# Patient Record
Sex: Male | Born: 1995 | Race: Black or African American | Hispanic: No | Marital: Single | Smoking: Former smoker
Health system: Southern US, Community
[De-identification: ages and names within clinical notes are randomized; demographics above are authoritative.]

## PROBLEM LIST (undated history)

## (undated) ENCOUNTER — Ambulatory Visit: Admission: EM | Source: Home / Self Care

## (undated) DIAGNOSIS — K2921 Alcoholic gastritis with bleeding: Secondary | ICD-10-CM

## (undated) HISTORY — PX: DENTAL RESTORATION/EXTRACTION WITH X-RAY: SHX5796

---

## 2017-08-16 ENCOUNTER — Other Ambulatory Visit: Payer: Self-pay

## 2017-08-16 ENCOUNTER — Encounter: Payer: Self-pay | Admitting: *Deleted

## 2017-08-16 ENCOUNTER — Emergency Department
Admission: EM | Admit: 2017-08-16 | Discharge: 2017-08-17 | Disposition: A | Discharge status: Home / Self Care | Payer: Self-pay | Attending: Emergency Medicine | Admitting: Emergency Medicine

## 2017-08-16 DIAGNOSIS — F329 Major depressive disorder, single episode, unspecified: Secondary | ICD-10-CM | POA: Insufficient documentation

## 2017-08-16 DIAGNOSIS — F4325 Adjustment disorder with mixed disturbance of emotions and conduct: Secondary | ICD-10-CM

## 2017-08-16 DIAGNOSIS — F17210 Nicotine dependence, cigarettes, uncomplicated: Secondary | ICD-10-CM | POA: Insufficient documentation

## 2017-08-16 DIAGNOSIS — F1020 Alcohol dependence, uncomplicated: Secondary | ICD-10-CM

## 2017-08-16 DIAGNOSIS — K2970 Gastritis, unspecified, without bleeding: Secondary | ICD-10-CM

## 2017-08-16 DIAGNOSIS — R45851 Suicidal ideations: Secondary | ICD-10-CM | POA: Insufficient documentation

## 2017-08-16 DIAGNOSIS — F1021 Alcohol dependence, in remission: Secondary | ICD-10-CM

## 2017-08-16 DIAGNOSIS — T424X1A Poisoning by benzodiazepines, accidental (unintentional), initial encounter: Secondary | ICD-10-CM

## 2017-08-16 DIAGNOSIS — T424X2A Poisoning by benzodiazepines, intentional self-harm, initial encounter: Secondary | ICD-10-CM | POA: Insufficient documentation

## 2017-08-16 DIAGNOSIS — F322 Major depressive disorder, single episode, severe without psychotic features: Secondary | ICD-10-CM

## 2017-08-16 DIAGNOSIS — T50902A Poisoning by unspecified drugs, medicaments and biological substances, intentional self-harm, initial encounter: Secondary | ICD-10-CM

## 2017-08-16 DIAGNOSIS — F339 Major depressive disorder, recurrent, unspecified: Secondary | ICD-10-CM | POA: Insufficient documentation

## 2017-08-16 HISTORY — DX: Poisoning by benzodiazepines, accidental (unintentional), initial encounter: T42.4X1A

## 2017-08-16 HISTORY — DX: Alcoholic gastritis with bleeding: K29.21

## 2017-08-16 LAB — CBC
HCT: 47.5 % (ref 40.0–52.0)
Hemoglobin: 16.5 g/dL (ref 13.0–18.0)
MCH: 32.7 pg (ref 26.0–34.0)
MCHC: 34.7 g/dL (ref 32.0–36.0)
MCV: 94.1 fL (ref 80.0–100.0)
PLATELETS: 213 10*3/uL (ref 150–440)
RBC: 5.05 MIL/uL (ref 4.40–5.90)
RDW: 13.7 % (ref 11.5–14.5)
WBC: 6.5 10*3/uL (ref 3.8–10.6)

## 2017-08-16 LAB — COMPREHENSIVE METABOLIC PANEL
ALBUMIN: 4.3 g/dL (ref 3.5–5.0)
ALK PHOS: 51 U/L (ref 38–126)
ALT: 15 U/L (ref 0–44)
AST: 18 U/L (ref 15–41)
Anion gap: 10 (ref 5–15)
BILIRUBIN TOTAL: 0.6 mg/dL (ref 0.3–1.2)
BUN: 14 mg/dL (ref 6–20)
CALCIUM: 9.3 mg/dL (ref 8.9–10.3)
CO2: 25 mmol/L (ref 22–32)
CREATININE: 1.01 mg/dL (ref 0.61–1.24)
Chloride: 107 mmol/L (ref 98–111)
GFR calc Af Amer: >60 mL/min (ref >60)
GFR calc non Af Amer: >60 mL/min (ref >60)
GLUCOSE: 89 mg/dL (ref 70–99)
Potassium: 3.8 mmol/L (ref 3.5–5.1)
Sodium: 142 mmol/L (ref 135–145)
Total Protein: 7.4 g/dL (ref 6.5–8.1)

## 2017-08-16 LAB — ETHANOL: Alcohol, Ethyl (B): 75 mg/dL — ABNORMAL HIGH (ref <10)

## 2017-08-16 MED ORDER — TRAZODONE HCL 100 MG PO TABS: 100.0000 mg | ORAL_TABLET | Freq: Every evening | ORAL | Status: DC | PRN

## 2017-08-16 MED ORDER — PANTOPRAZOLE SODIUM 40 MG PO TBEC
40.0000 mg | DELAYED_RELEASE_TABLET | Freq: Two times a day (BID) | ORAL | Status: DC
  Administered 2017-08-17 (×2): 40 mg via ORAL
  Filled 2017-08-16 (×2): qty 1

## 2017-08-16 MED ORDER — HYDROXYZINE HCL 25 MG PO TABS
50.0000 mg | ORAL_TABLET | Freq: Four times a day (QID) | ORAL | Status: DC | PRN
  Administered 2017-08-17: 50 mg via ORAL
  Filled 2017-08-16 (×2): qty 2

## 2017-08-16 NOTE — Consult Note (Signed)
[place] [facility] Consult   Reason for Consult: Consult for 22 year old man who comes to the hospital after overdosing intentionally on clonazepam Referring Physician: [facility] Patient Identification: Kevontay Burks MRN:  [id number] Principal Diagnosis: Severe major depression, single episode, without psychotic features [facility]) Diagnosis:   Patient Active Problem List   Diagnosis Date Noted  . Severe major depression, single episode, without psychotic features ([place]) [F32.2] 08/16/2017  . Benzodiazepine overdose [T42.4X1A] 08/16/2017    Total Time spent with patient: 1 hour  Subjective:   Cejay Cambre is a 22 y.o. male patient admitted with "I just did not want to go on".  HPI: Patient interviewed chart reviewed.  28 year old man came to the emergency room after his roommate called the authorities.  Patient says that he took about 18 tablets of clonazepam today.  He says that he took about 6 of them by mouth and then ground the rest of them up and snorted them.  Admits that he has been drinking heavily recently as well although he was not drinking today.  Last drink was last night.  Patient went to his doctor earlier today because of a recurrent gastritis with GI bleeding and was told that he could not drink which apparently is why he chose to use the Klonopin instead.  Patient says his mood recently has been down and depressed.  Sleep is chronically poor.  Appetite is poor.  He says he is having suicidal thoughts and feels like life is not worth living and that he has nothing to live for.  Denies any psychosis.  Major life stresses are that he is living with a man he is in a relationship with but the relationship is becoming disappointing and frustrating to him.  Social history: Minimal family contact.  Lives with a man that he is in a relationship with but seems to find the relationship unsatisfactory.  Medical history: Patient has had recurrent episodes of vomiting blood from gastritis  from alcohol abuse.  Substance abuse history: Long-standing heavy alcohol abuse.  No history of seizures or delirium tremens.  Has not been involved in detox treatment or rehab  Past Psychiatric History: No history of psychiatric treatment.  Never seen a mental health provider in the past.  No history of psychiatric medicine or hospitalization  Risk to Self: Suicidal Ideation: Yes-Currently Present Suicidal Intent: Yes-Currently Present Is patient at risk for suicide?: Yes Suicidal Plan?: Yes-Currently Present Specify Current Suicidal Plan: OD on Klonopin Access to Means: Yes Specify Access to Suicidal Means: pills What has been your use of drugs/alcohol within the last 12 months?: ETOH How many times?: 1 Other Self Harm Risks: Ongoing ETOH abuse Triggers for Past Attempts: Other personal contacts Intentional Self Injurious Behavior: None Risk to Others: Homicidal Ideation: No Thoughts of Harm to Others: No Current Homicidal Intent: No Current Homicidal Plan: No Access to Homicidal Means: No Identified Victim: None reported History of harm to others?: No Assessment of Violence: None Noted Violent Behavior Description: None reported Does patient have access to weapons?: No Criminal Charges Pending?: No Does patient have a court date: No Prior Inpatient Therapy: Prior Inpatient Therapy: No Prior Outpatient Therapy: Prior Outpatient Therapy: No Does patient have an ACCT team?: No Does patient have Intensive In-House Services?  : No Does patient have [facility] services? : No Does patient have P4CC services?: No  Past Medical History:  Past Medical History:  Diagnosis Date  . Alcoholic gastritis with bleeding     Family History: No family history on  file. Family Psychiatric  History: Does not know of any Social History:  Social History   Substance and Sexual Activity  Alcohol Use Yes   Comment: 0.5 fifth of liquor 08/16/17     Social History   Substance and Sexual  Activity  Drug Use Not Currently    Social History   Socioeconomic History  . Marital status: Single    Spouse name: Not on file  . Number of children: Not on file  . Years of education: Not on file  . Highest education level: Not on file  Occupational History  . Not on file  Social Needs  . Financial resource strain: Not on file  . Food insecurity:    Worry: Not on file    Inability: Not on file  . Transportation needs:    Medical: Not on file    Non-medical: Not on file  Tobacco Use  . Smoking status: Current Some Day Smoker    Types: Cigarettes  . Smokeless tobacco: Never Used  Substance and Sexual Activity  . Alcohol use: Yes    Comment: 0.5 fifth of liquor 08/16/17  . Drug use: Not Currently  . Sexual activity: Yes  Lifestyle  . Physical activity:    Days per week: Not on file    Minutes per session: Not on file  . Stress: Not on file  Relationships  . Social connections:    Talks on phone: Not on file    Gets together: Not on file    Attends religious service: Not on file    Active member of club or organization: Not on file    Attends meetings of clubs or organizations: Not on file    Relationship status: Not on file  Other Topics Concern  . Not on file  Social History Narrative  . Not on file   Additional Social History:    Allergies:  Allergies no known allergies  Labs:  Results for orders placed or performed during the hospital encounter of 08/16/17 (from the past 48 hour(s))  Comprehensive metabolic panel     Status: None   Collection Time: 08/16/17  4:23 PM  Result Value Ref Range   Sodium 142 135 - 145 mmol/L   Potassium 3.8 3.5 - 5.1 mmol/L    Comment: HEMOLYSIS AT THIS LEVEL MAY AFFECT RESULT   Chloride 107 98 - 111 mmol/L    Comment: Please note change in reference range.   CO2 25 22 - 32 mmol/L   Glucose, Bld 89 70 - 99 mg/dL    Comment: Please note change in reference range.   BUN 14 6 - 20 mg/dL    Comment: Please note change in  reference range.   Creatinine, Ser 1.01 0.61 - 1.24 mg/dL   Calcium 9.3 8.9 - 10.3 mg/dL   Total Protein 7.4 6.5 - 8.1 g/dL   Albumin 4.3 3.5 - 5.0 g/dL   AST 18 15 - 41 U/L   ALT 15 0 - 44 U/L    Comment: Please note change in reference range.   Alkaline Phosphatase 51 38 - 126 U/L   Total Bilirubin 0.6 0.3 - 1.2 mg/dL   GFR calc non Af Amer >60 >60 mL/min   GFR calc Af Amer >60 >60 mL/min    Comment: (NOTE) The eGFR has been calculated using the CKD EPI equation. This calculation has not been validated in all clinical situations. eGFR's persistently <60 mL/min signify possible Chronic Kidney Disease.    Anion gap  10 5 - 15    Comment: Performed at [facility], [address] [place], [place] [address]  Ethanol     Status: Abnormal   Collection Time: 08/16/17  4:23 PM  Result Value Ref Range   Alcohol, Ethyl (B) 75 (H) <10 mg/dL    Comment: (NOTE) Lowest detectable limit for serum alcohol is 10 mg/dL. For medical purposes only. Performed at [facility], [address] [place], [place] [address]   cbc     Status: None   Collection Time: 08/16/17  4:23 PM  Result Value Ref Range   WBC 6.5 3.8 - 10.6 K/uL   RBC 5.05 4.40 - 5.90 MIL/uL   Hemoglobin 16.5 13.0 - 18.0 g/dL   HCT 47.5 40.0 - 52.0 %   MCV 94.1 80.0 - 100.0 fL   MCH 32.7 26.0 - 34.0 pg   MCHC 34.7 32.0 - 36.0 g/dL   RDW 13.7 11.5 - 14.5 %   Platelets 213 150 - 440 K/uL    Comment: Performed at [facility], [address] [place], [place] [address]    No current facility-administered medications for this encounter.    No current outpatient medications on file.    Musculoskeletal: Strength & Muscle Tone: within normal limits Gait & Station: normal Patient leans: N/A  Psychiatric Specialty Exam: Physical Exam  Nursing note and vitals reviewed. Constitutional: He appears well-developed and well-nourished.  HENT:  Head: Normocephalic and atraumatic.  Eyes: Pupils are  equal, round, and reactive to light. Conjunctivae are normal.  Neck: Normal range of motion.  Cardiovascular: Normal heart sounds.  Respiratory: Effort normal. No respiratory distress.  GI: Soft.  Musculoskeletal: Normal range of motion.  Neurological: He is alert.  Skin: Skin is warm and dry.  Psychiatric: His affect is blunt. His speech is delayed. He is slowed. Cognition and memory are normal. He expresses impulsivity. He exhibits a depressed mood. He expresses suicidal ideation. He expresses suicidal plans.    Review of Systems  Constitutional: Negative.   HENT: Negative.   Eyes: Negative.   Respiratory: Negative.   Cardiovascular: Negative.   Gastrointestinal: Negative.   Musculoskeletal: Negative.   Skin: Negative.   Neurological: Negative.   Psychiatric/Behavioral: Positive for depression, substance abuse and suicidal ideas. Negative for hallucinations and memory loss. The patient is nervous/anxious and has insomnia.     Blood pressure 138/87, pulse 86, temperature 98 F (36.7 C), temperature source Oral, resp. rate 17, height 5' 10"  (1.778 m), weight 72.6 kg (160 lb), SpO2 100 %.Body mass index is 22.96 kg/m.  General Appearance: Fairly Groomed  Eye Contact:  Good  Speech:  Clear and Coherent  Volume:  Normal  Mood:  Dysphoric  Affect:  Congruent  Thought Process:  Goal Directed  Orientation:  Full (Time, Place, and Person)  Thought Content:  Logical  Suicidal Thoughts:  Yes.  with intent/plan  Homicidal Thoughts:  No  Memory:  Immediate;   Fair Recent;   Fair Remote;   Fair  Judgement:  Impaired  Insight:  Shallow  Psychomotor Activity:  Decreased  Concentration:  Concentration: Fair  Recall:  [employer] of Knowledge:  Fair  Language:  Fair  Akathisia:  No  Handed:  Right  AIMS (if indicated):     Assets:  Desire for Improvement Housing Physical Health Resilience  ADL's:  Intact  Cognition:  WNL  Sleep:        Treatment Plan Summary: Daily contact  with patient to  assess and evaluate symptoms and progress in treatment, Medication management and Plan 22 year old man who admits that he was trying to kill himself today by overdose on clonazepam.  Multiple recent symptoms of depression.  Recommendation is for admission to psychiatric ward.  Continue involuntary commitment.  Situation explained to the patient.  Make sure he is on medication for his gastritis.  Full set of labs will be done.  PRN medicine for sleep and agitation.  Disposition: Recommend psychiatric Inpatient admission when medically cleared.  Alethia Berthold, MD 08/16/2017 6:49 PM

## 2017-08-16 NOTE — BH Assessment (Signed)
Patient's referral information faxed to:   [facility]    [address] [place] [address] Phone: [phone] Fax: [phone] [facility]    [address], [place] [address] Phone: [phone] Fax: [phone] [facility]    [address] [place] [address] Phone: [phone] Fax: [phone] [facility]   [address] [place] [address] Phone: [phone] Fax: [phone] [facility]    [address] [place] [address] Phone: [phone] Fax: [phone] [facility]   [address] [place] [address] Phone: [phone] Fax: [phone] [facility]   [address] [place] [address] Phone: [phone] Fax: [phone] [facility]   [address] [place] [address] Phone: [phone] Fax: [phone] [facility]    [address], [place] [address] Phone: [phone] Fax: [phone] [facility]    [address] [place] [address] Phone: [phone] Fax: [phone] [facility]    [address] [place] [address] Phone: [phone] Fax: [phone] [facility]   [address] [place]  [address] Phone: [phone] Fax: [phone] [facility]    [address] [place]  [address] Phone: [phone] Fax: [phone] [facility]   [address] [place]  [address] Phone: [phone] Fax: [phone] 

## 2017-08-16 NOTE — ED Notes (Addendum)
First Nurse Note:  Patient presents to the ED via EMS for suicidal ideation and overdose.  Patient came to the ED voluntarily.  Patient reports taking orally and snorting 18 klonopin today, unknown mg.

## 2017-08-16 NOTE — ED Triage Notes (Signed)
Patient arrived by EMS for overdose of klonopin. Patient reports unknown MG but taking between 12-18 klonopin. Patient reports he took klonopin to hurt himself. When asked if patient still wanted to hurt himself pt stated "I do some"

## 2017-08-16 NOTE — ED Notes (Signed)
[facility] getting a [facility] there. Pt will not be medically cleared until midnight. The cutoff for the facility os 2200. Will notify [facility] in beh and they will check back in the morning on pt.

## 2017-08-16 NOTE — ED Notes (Signed)
Visitation rules provided to aunt and older brother, mother on way but not available at this time. PW: [id number]. Pt informed.

## 2017-08-16 NOTE — ED Notes (Signed)
IVC/ Consult ordered/completed pending BMU admit

## 2017-08-16 NOTE — ED Notes (Signed)
Pt moved to room 21. Pt given update on plan to monitor due to overdose. Explained to pt why he is being moved. Pt is calm and agreeable. Pt denies SI at this time. Pt admits that he became stressed out and did something he should not.

## 2017-08-16 NOTE — ED Provider Notes (Addendum)
[facility] Emergency Department Provider Note  Time seen: 6:27 PM  I have reviewed the triage vital signs and the nursing notes.   HISTORY  Chief Complaint Drug Overdose    HPI Keith Dudley is a 22 y.o. male with a past medical history of alcoholism presents to the emergency department for suicidal ideation and attempted overdose.  According to the patient he has been having "issues" and has become increasingly depressed per patient.  States he drank nearly 1/5 of alcohol today, claims to have taken 5 or 6 Klonopin tablets orally and snorted an additional 5 or 6 tablets per patient.  He is not entirely sure of the quantity.  Does not know the strength of the tablets.  States this occurred around 3 PM today.  Denies taking any other medications.  Denies any recreational drug use otherwise.  Denies any active thoughts to hurt himself or anybody else.   Past Medical History:  Diagnosis Date  . Alcoholic gastritis with bleeding     There are no active problems to display for this patient.   Prior to Admission medications   Not on File    Allergies no known allergies  No family history on file.  Social History Social History   Tobacco Use  . Smoking status: Current Some Day Smoker    Types: Cigarettes  . Smokeless tobacco: Never Used  Substance Use Topics  . Alcohol use: Yes    Comment: 0.5 fifth of liquor 08/16/17  . Drug use: Not Currently    Review of Systems Constitutional: Negative for fever. Cardiovascular: Negative for chest pain. Respiratory: Negative for shortness of breath. Gastrointestinal: Negative for abdominal pain, vomiting Genitourinary: Negative for urinary compaints Musculoskeletal: Negative for musculoskeletal complaints Skin: Negative for skin complaints  Neurological: Negative for headache All other ROS negative  ____________________________________________   PHYSICAL EXAM:  VITAL SIGNS: ED Triage Vitals  Enc Vitals  Group     BP 08/16/17 1614 140/84     Pulse Rate 08/16/17 1614 97     Resp 08/16/17 1614 18     Temp 08/16/17 1614 98 F (36.7 C)     Temp Source 08/16/17 1614 Oral     SpO2 08/16/17 1614 99 %     Weight 08/16/17 1616 160 lb (72.6 kg)     Height 08/16/17 1616 5\' 10"  (1.778 m)     Head Circumference --      Peak Flow --      Pain Score 08/16/17 1614 0     Pain Loc --      Pain Edu? --      Excl. in GC? --    Constitutional: Alert and oriented. Well appearing and in no distress. Eyes: Normal exam ENT   Head: Normocephalic and atraumatic.   Mouth/Throat: Mucous membranes are moist. Cardiovascular: Normal rate, regular rhythm.  Respiratory: Normal respiratory effort without tachypnea nor retractions. Breath sounds are clear  Gastrointestinal: Soft and nontender. No distention.  Musculoskeletal: Nontender with normal range of motion in all extremities.  Neurologic:  Normal speech and language. No gross focal neurologic deficits  Skin:  Skin is warm, dry and intact.  Psychiatric: Mood and affect are normal.  Denies active SI or HI.  ____________________________________________    EKG  EKG reviewed and interpreted by myself shows sinus rhythm at 90 bpm with a narrow QRS, normal axis, normal intervals, no concerning ST changes.  ____________________________________________   INITIAL IMPRESSION / ASSESSMENT AND PLAN / ED COURSE  Pertinent labs & imaging results that were available during my care of the patient were reviewed by me and considered in my medical decision making (see chart for details).  Patient presents to the emergency department after intentional overdose for admitted depression.  Patient told the triage nurse he took approximate 18 tablets, told my PA student that he only took 2 tablets, and told myself that he took 10 to 12 tablets.  It is not entirely clear how many tablets patient took or if the patient took any tablets at all.  Patient does have a  slightly elevated alcohol level currently 75.  Does not appear overly intoxicated at this time.  Does not appear somnolent.  Normal respiratory rate.  100% saturation on room air.  However given the possibility of Klonopin use we will continue to monitor the patient for at least 8 hours in the emergency department.  I have placed the patient under a commitment order.  Patient has been seen by psychiatry, they recommend inpatient admission once the patient is medically clear.  Patient's lab work thus far has been nonrevealing besides an alcohol level of 75.  Urine drug screen pending.  Continues to be awake alert and oriented.  ----------------------------------------- 11:00 PM on 08/16/2017 -----------------------------------------  Patient remains externally well-appearing, refuses to wear the cardiac monitor had taken off all the leads.  Does not appear somnolent, still awake alert oriented able to answer questions appropriately.  Patient is medically cleared at this time awaiting psychiatric admission. ____________________________________________   FINAL CLINICAL IMPRESSION(S) / ED DIAGNOSES  Overdose Suicidal ideation    Minna AntisPaduchowski, Sonja Manseau, MD 08/16/17 [id number]    Minna AntisPaduchowski, Seraphine Gudiel, MD 08/16/17 [phone]
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
## 2017-08-17 DIAGNOSIS — F4325 Adjustment disorder with mixed disturbance of emotions and conduct: Secondary | ICD-10-CM

## 2017-08-17 DIAGNOSIS — K2970 Gastritis, unspecified, without bleeding: Secondary | ICD-10-CM

## 2017-08-17 DIAGNOSIS — F1020 Alcohol dependence, uncomplicated: Secondary | ICD-10-CM

## 2017-08-17 DIAGNOSIS — F1021 Alcohol dependence, in remission: Secondary | ICD-10-CM

## 2017-08-17 HISTORY — DX: Adjustment disorder with mixed disturbance of emotions and conduct: F43.25

## 2017-08-17 LAB — URINE DRUG SCREEN, QUALITATIVE (ARMC ONLY)
Amphetamines, Ur Screen: NOT DETECTED
Benzodiazepine, Ur Scrn: NOT DETECTED
Cannabinoid 50 Ng, Ur ~~LOC~~: POSITIVE — AB
Cocaine Metabolite,Ur ~~LOC~~: NOT DETECTED
MDMA (ECSTASY) UR SCREEN: NOT DETECTED
Methadone Scn, Ur: NOT DETECTED
Opiate, Ur Screen: NOT DETECTED
PHENCYCLIDINE (PCP) UR S: NOT DETECTED
Tricyclic, Ur Screen: NOT DETECTED

## 2017-08-17 MED ORDER — NICOTINE 14 MG/24HR TD PT24
14.0000 mg | MEDICATED_PATCH | Freq: Every day | TRANSDERMAL | Status: DC
  Administered 2017-08-17: 14 mg via TRANSDERMAL
  Filled 2017-08-17: qty 1

## 2017-08-17 NOTE — Progress Notes (Signed)
Pt A & O X4. Denies SI, HI, AVH and pain at this time "I just want to go home and go to work that's all". D/C instructions reviewed with pt; compliance encouraged and understanding verbalized by pt. Ambulatory with a steady gait. Appears to be in no physical distress at time of departure. Pt d/c home as ordered. Picked up in lobby by his aunt.

## 2017-08-17 NOTE — BH Assessment (Signed)
Per Dr. Shary Keylapac's patient no longer meets criteria for inpatient psychiatric treatment and will be d/c.

## 2017-08-17 NOTE — ED Notes (Signed)

## 2017-08-17 NOTE — ED Provider Notes (Signed)
Dr. Toni Amendlapacs has lifted the patient's involuntary commitment and recommended outpatient management.   Merrily Brittleifenbark, Durenda Pechacek, MD 08/17/17 1535

## 2017-08-17 NOTE — ED Notes (Signed)
IVC/Pending placement 

## 2017-08-17 NOTE — ED Provider Notes (Signed)
-----------------------------------------   7:35 AM on 08/17/2017 -----------------------------------------   Blood pressure 123/77, pulse 75, temperature 98 F (36.7 C), temperature source Oral, resp. rate 16, height 5\' 10"  (1.778 m), weight 72.6 kg (160 lb), SpO2 100 %.  The patient had no acute events since last update.  Calm and cooperative at this time.  Disposition is pending Psychiatry/Behavioral Medicine team recommendations.     Irean HongSung, Latonyia Lopata J, MD 08/17/17 [phone]

## 2017-08-17 NOTE — BH Assessment (Signed)
Re-[facility]ed [facility]errals to [facility] [phone] and [facility] [phone]) per reps request.

## 2017-08-17 NOTE — BH Assessment (Addendum)
[facility]- Per Casimiro NeedleMichael, haven't received referrals.                      [address] [place] [address] Phone: [phone] Fax: [phone]  [facility] - Per Morrie SheldonAshley, at capacity.                                 [address], [place] [address] Phone: [phone] Fax: [phone]  [facility] - Per Kenney Housemananya, no beds available.                                  [address] [place] [address] Phone: [phone] Fax: [phone]  [facility]- Per Cece, no referrals received.                    [address] [place] [address] Phone: [phone] Fax: [phone]  [facility] - Per Tresa EndoKelly,  patient is on waitlist.                              [address] [place] [address] Phone: [phone] Fax: [phone]  [facility]- Unable to speak with anyone, left HIPPA compliant message.              [address] [place] [address] Phone: [phone] Fax: [phone]  [facility] - Per Sheralyn Boatmanoni, referrals not received and request referrals faxed to [phone]            [address] [place] [address] Phone: [phone] Fax: [phone]   [facility]- Unable to speak with anyone, left HIPPA compliant message. [address] [place] [address] Phone: [phone] Fax: [phone]  [facility]- Unable to speak with anyone.                             [address], [place] [address] Phone: [phone] Fax: [phone]  [facility]  - Per Tammy, no beds available.                                 [address] [place] [address] Phone: [phone] Fax: [phone]  [facility]- Per Samson FredericElla, no self pay beds, may have some self pay beds tomorrow.  [address] [place] [address] Phone: [phone] Fax: [phone]  [facility]- Per  Shanda BumpsJessica, at capacity.              [address] [place] [address] Phone: [phone] Fax: [phone]  [facility]- Per Wynona Caneshristine, denied due to self pay.                      [address] [place] [address] Phone: [phone] Fax: [phone]  [facility]- Per Efraim KaufmannMelissa, patient is out of catchment area.            [address] [place] [address] Phone: [phone] Fax: [phone]

## 2017-08-17 NOTE — BH Assessment (Signed)
[facility] by Dr. Toni Amendlapacs Attending Physician will be Dr. Jennet MaduroPucilowska.   Patient has been assigned to room 320, by [facility] Charge Nurse LytlePhyllis.   Intake Paper Work has been signed and placed on patient chart.  ER staff is aware of the admission:  Glenda: ER Secretary   Dr. Rogers Seedsifenback: ER MD   Olivette: Patient's Nurse   Dedra SkeensGwen: Patient Access.

## 2017-08-17 NOTE — Consult Note (Signed)
[facility] Consult   Reason for Consult: Consult for 22 year old man follow-up his experience in the emergency room from yesterday Referring Physician: Rifenbark Patient Identification: Keith Dudley MRN:  [id number] Principal Diagnosis: Adjustment disorder with mixed disturbance of emotions and conduct Diagnosis:   Patient Active Problem List   Diagnosis Date Noted  . Alcohol abuse [F10.10] 08/17/2017  . Adjustment disorder with mixed disturbance of emotions and conduct [F43.25] 08/17/2017  . Gastritis [K29.70] 08/17/2017  . Severe major depression, single episode, without psychotic features ([place]) [F32.2] 08/16/2017  . Benzodiazepine overdose [T42.4X1A] 08/16/2017    Total Time spent with patient: 30 minutes  Subjective:   Keith Dudley is a 22 y.o. male patient admitted with "I am doing fine".  HPI: See yesterday's note.  This 22 year old man came into the hospital after snorting a bunch of clonazepam and making suicidal statements.  When I saw him yesterday afternoon he was still talking about how he had done it because he wished that he would die.  Patient was pended for admission but no bed availability came up last night.  On reassessment today the patient says he is feeling much better.  Affect is brighter.  Mood is calm.  He says he has thought through his situation and no longer feels upset about it.  He has found out that his trouble some roommate/boyfriend has moved out so now he feels comfortable going home.  Patient denies any suicidal ideation at all now.  He is open to the idea that his drinking has become a problem for him and ought to change.  Patient gives a history of daily alcohol abuse that has become a greater problem until it has caused him to have gastritis  Medical history: Acute gastritis  Social history: Had been living with a roommate who apparently was also his boyfriend.  Works at [employer].  Past Psychiatric History: See previous  note.  Patient has had some therapy as a child but as an adult is not receiving any active mental health treatment  Risk to Self: Suicidal Ideation: Yes-Currently Present Suicidal Intent: Yes-Currently Present Is patient at risk for suicide?: Yes Suicidal Plan?: Yes-Currently Present Specify Current Suicidal Plan: OD on Klonopin Access to Means: Yes Specify Access to Suicidal Means: pills What has been your use of drugs/alcohol within the last 12 months?: ETOH How many times?: 1 Other Self Harm Risks: Ongoing ETOH abuse Triggers for Past Attempts: Other personal contacts Intentional Self Injurious Behavior: None Risk to Others: Homicidal Ideation: No Thoughts of Harm to Others: No Current Homicidal Intent: No Current Homicidal Plan: No Access to Homicidal Means: No Identified Victim: None reported History of harm to others?: No Assessment of Violence: None Noted Violent Behavior Description: None reported Does patient have access to weapons?: No Criminal Charges Pending?: No Does patient have a court date: No Prior Inpatient Therapy: Prior Inpatient Therapy: No Prior Outpatient Therapy: Prior Outpatient Therapy: No Does patient have an ACCT team?: No Does patient have Intensive In-House Services?  : No Does patient have [facility] services? : No Does patient have P4CC services?: No  Past Medical History:  Past Medical History:  Diagnosis Date  . Alcoholic gastritis with bleeding     Family History: No family history on file. Family Psychiatric  History: None known Social History:  Social History   Substance and Sexual Activity  Alcohol Use Yes   Comment: 0.5 fifth of liquor 08/16/17     Social History   Substance and  Sexual Activity  Drug Use Not Currently    Social History   Socioeconomic History  . Marital status: Single    Spouse name: Not on file  . Number of children: Not on file  . Years of education: Not on file  . Highest education level: Not on file   Occupational History  . Not on file  Social Needs  . Financial resource strain: Not on file  . Food insecurity:    Worry: Not on file    Inability: Not on file  . Transportation needs:    Medical: Not on file    Non-medical: Not on file  Tobacco Use  . Smoking status: Current Some Day Smoker    Types: Cigarettes  . Smokeless tobacco: Never Used  Substance and Sexual Activity  . Alcohol use: Yes    Comment: 0.5 fifth of liquor 08/16/17  . Drug use: Not Currently  . Sexual activity: Yes  Lifestyle  . Physical activity:    Days per week: Not on file    Minutes per session: Not on file  . Stress: Not on file  Relationships  . Social connections:    Talks on phone: Not on file    Gets together: Not on file    Attends religious service: Not on file    Active member of club or organization: Not on file    Attends meetings of clubs or organizations: Not on file    Relationship status: Not on file  Other Topics Concern  . Not on file  Social History Narrative  . Not on file   Additional Social History:    Allergies:  Allergies no known allergies  Labs:  Results for orders placed or performed during the hospital encounter of 08/16/17 (from the past 48 hour(s))  Comprehensive metabolic panel     Status: None   Collection Time: 08/16/17  4:23 PM  Result Value Ref Range   Sodium 142 135 - 145 mmol/L   Potassium 3.8 3.5 - 5.1 mmol/L    Comment: HEMOLYSIS AT THIS LEVEL MAY AFFECT RESULT   Chloride 107 98 - 111 mmol/L    Comment: Please note change in reference range.   CO2 25 22 - 32 mmol/L   Glucose, Bld 89 70 - 99 mg/dL    Comment: Please note change in reference range.   BUN 14 6 - 20 mg/dL    Comment: Please note change in reference range.   Creatinine, Ser 1.01 0.61 - 1.24 mg/dL   Calcium 9.3 8.9 - 10.3 mg/dL   Total Protein 7.4 6.5 - 8.1 g/dL   Albumin 4.3 3.5 - 5.0 g/dL   AST 18 15 - 41 U/L   ALT 15 0 - 44 U/L    Comment: Please note change in reference range.    Alkaline Phosphatase 51 38 - 126 U/L   Total Bilirubin 0.6 0.3 - 1.2 mg/dL   GFR calc non Af Amer >60 >60 mL/min   GFR calc Af Amer >60 >60 mL/min    Comment: (NOTE) The eGFR has been calculated using the CKD EPI equation. This calculation has not been validated in all clinical situations. eGFR's persistently <60 mL/min signify possible Chronic Kidney Disease.    Anion gap 10 5 - 15    Comment: Performed at [facility], [address] [place], [place] [address]  Ethanol     Status: Abnormal   Collection Time: 08/16/17  4:23 PM  Result Value Ref Range  Alcohol, Ethyl (B) 75 (H) <10 mg/dL    Comment: (NOTE) Lowest detectable limit for serum alcohol is 10 mg/dL. For medical purposes only. Performed at [facility], [address] [place], [place] [address]   cbc     Status: None   Collection Time: 08/16/17  4:23 PM  Result Value Ref Range   WBC 6.5 3.8 - 10.6 K/uL   RBC 5.05 4.40 - 5.90 MIL/uL   Hemoglobin 16.5 13.0 - 18.0 g/dL   HCT 47.5 40.0 - 52.0 %   MCV 94.1 80.0 - 100.0 fL   MCH 32.7 26.0 - 34.0 pg   MCHC 34.7 32.0 - 36.0 g/dL   RDW 13.7 11.5 - 14.5 %   Platelets 213 150 - 440 K/uL    Comment: Performed at [facility], [address] [place], [place] [address]  Urine Drug Screen, Qualitative     Status: Abnormal   Collection Time: 08/17/17  5:56 AM  Result Value Ref Range   Tricyclic, Ur Screen NONE DETECTED NONE DETECTED   Amphetamines, Ur Screen NONE DETECTED NONE DETECTED   MDMA (Ecstasy)Ur Screen NONE DETECTED NONE DETECTED   Cocaine Metabolite,Ur [place] NONE DETECTED NONE DETECTED   Opiate, Ur Screen NONE DETECTED NONE DETECTED   Phencyclidine (PCP) Ur S NONE DETECTED NONE DETECTED   Cannabinoid 50 Ng, Ur [place] POSITIVE (A) NONE DETECTED   Barbiturates, Ur Screen (A) NONE DETECTED    Result not available. Reagent lot number recalled by manufacturer.   Benzodiazepine, Ur Scrn NONE DETECTED NONE DETECTED   Methadone Scn, Ur NONE  DETECTED NONE DETECTED    Comment: (NOTE) Tricyclics + metabolites, urine    Cutoff 1000 ng/mL Amphetamines + metabolites, urine  Cutoff 1000 ng/mL MDMA (Ecstasy), urine              Cutoff 500 ng/mL Cocaine Metabolite, urine          Cutoff 300 ng/mL Opiate + metabolites, urine        Cutoff 300 ng/mL Phencyclidine (PCP), urine         Cutoff 25 ng/mL Cannabinoid, urine                 Cutoff 50 ng/mL Barbiturates + metabolites, urine  Cutoff 200 ng/mL Benzodiazepine, urine              Cutoff 200 ng/mL Methadone, urine                   Cutoff 300 ng/mL The urine drug screen provides only a preliminary, unconfirmed analytical test result and should not be used for non-medical purposes. Clinical consideration and professional judgment should be applied to any positive drug screen result due to possible interfering substances. A more specific alternate chemical method must be used in order to obtain a confirmed analytical result. Gas chromatography / mass spectrometry (GC/MS) is the preferred confirmat ory method. Performed at [facility], [address] [place], [place] [address]     Current Facility-Administered Medications  Medication Dose Route Frequency Provider Last Rate Last Dose  . hydrOXYzine (ATARAX/VISTARIL) tablet 50 mg  50 mg Oral Q6H PRN Miriana Gaertner, Madie Reno, MD   50 mg at 08/17/17 0040  . nicotine (NICODERM CQ - dosed in mg/24 hours) patch 14 mg  14 mg Transdermal Daily Arta Silence, MD   14 mg at 08/17/17 0924  . pantoprazole (PROTONIX) EC tablet 40 mg  40 mg Oral BID Octavia Velador, Madie Reno, MD   40  mg at 08/17/17 0924  . traZODone (DESYREL) tablet 100 mg  100 mg Oral QHS PRN Vickey Boak, Madie Reno, MD       No current outpatient medications on file.    Musculoskeletal: Strength & Muscle Tone: within normal limits Gait & Station: normal Patient leans: N/A  Psychiatric Specialty Exam: Physical Exam  Nursing note and vitals reviewed. Constitutional: He  appears well-developed and well-nourished.  HENT:  Head: Normocephalic and atraumatic.  Eyes: Pupils are equal, round, and reactive to light. Conjunctivae are normal.  Neck: Normal range of motion.  Cardiovascular: Regular rhythm and normal heart sounds.  Respiratory: Effort normal. No respiratory distress.  GI: Soft.  Musculoskeletal: Normal range of motion.  Neurological: He is alert.  Skin: Skin is warm and dry.  Psychiatric: He has a normal mood and affect. His behavior is normal. Judgment and thought content normal.    Review of Systems  Constitutional: Negative.   HENT: Negative.   Eyes: Negative.   Respiratory: Negative.   Cardiovascular: Negative.   Gastrointestinal: Negative.   Musculoskeletal: Negative.   Skin: Negative.   Neurological: Negative.   Psychiatric/Behavioral: Negative.     Blood pressure 123/77, pulse 75, temperature 98 F (36.7 C), temperature source Oral, resp. rate 16, height _0  (1.778 m), weight 72.6 kg (160 lb), SpO2 100 %.Body mass index is 22.96 kg/m.  General Appearance: Fairly Groomed  Eye Contact:  Good  Speech:  Clear and Coherent  Volume:  Decreased  Mood:  Euthymic  Affect:  Appropriate and Congruent  Thought Process:  Goal Directed  Orientation:  Full (Time, Place, and Person)  Thought Content:  Logical  Suicidal Thoughts:  No  Homicidal Thoughts:  No  Memory:  Immediate;   Fair Recent;   Fair Remote;   Fair  Judgement:  Fair  Insight:  Fair  Psychomotor Activity:  Normal  Concentration:  Concentration: Fair  Recall:  [employer] of Knowledge:  Fair  Language:  Fair  Akathisia:  No  Handed:  Right  AIMS (if indicated):     Assets:  Desire for Improvement Housing Resilience  ADL's:  Intact  Cognition:  WNL  Sleep:        Treatment Plan Summary: Plan On reevaluation today patient appears to have resolved his mood symptoms.  He is upbeat calm and appropriate.  Open to reasonable conversation about his alcohol abuse.   Still requesting discharge.  Completely denies suicidal thought.  At this point I am changing his diagnosis to adjustment disorder and recommending that he be discharged from the emergency room but with a recommendation to follow-up with [facility] for alcohol abuse and mood symptoms.  Counseling completed with the patient who agrees to the plan.  Discontinue IVC.  Case reviewed with emergency room physician and TTS  Disposition: No evidence of imminent risk to self or others at present.   Patient does not meet criteria for psychiatric inpatient admission. Supportive therapy provided about ongoing stressors. Discussed crisis plan, support from social network, calling 911, coming to the Emergency Department, and calling Suicide Hotline.  Alethia Berthold, MD 08/17/2017 4:16 PM

## 2017-08-17 NOTE — ED Notes (Signed)
Pt. To B[facility] from ED ambulatory without difficulty, to room  [facility] Pt. Is alert and oriented, warm and dry in no distress. Pt. Denies SI, HI, and AVH. Pt. Calm and cooperative. Pt. Made aware of security cameras and Q15 minute rounds. Pt. Encouraged to let Nursing staff know of any concerns or needs.

## 2017-08-17 NOTE — BH Assessment (Signed)
Per Dr. Toni Amendlapacs pt meets criteria for inpatient treatment. This writer asked BMU CN Alexis for status in regards to bed number assignment. Per CN Alexis she is not taking pts to be admitted onto the unit due to high pt acuity and low staff availability. This Clinical research associatewriter [facility] see if [facility] could [facility] pt and was advised that they too are working with a high acuity as well. I was informed that the pt chart would be reviewed for possible placement if at all available. The pt has been faxed out to other locations and [facility]

## 2017-08-17 NOTE — Discharge Instructions (Signed)
It was a pleasure to take care of you today, and thank you for coming to our emergency department.  If you have any questions or concerns before leaving please ask the nurse to grab me and I'm more than happy to go through your aftercare instructions again.  If you were prescribed any opioid pain medication today such as Norco, Vicodin, Percocet, morphine, hydrocodone, or oxycodone please make sure you do not drive when you are taking this medication as it can alter your ability to drive safely.  If you have any concerns once you are home that you are not improving or are in fact getting worse before you can make it to your follow-up appointment, please do not hesitate to call 911 and come back for further evaluation.  Merrily BrittleNeil Dillan Candela, MD  Results for orders placed or performed during the hospital encounter of 08/16/17  Comprehensive metabolic panel  Result Value Ref Range   Sodium 142 135 - 145 mmol/L   Potassium 3.8 3.5 - 5.1 mmol/L   Chloride 107 98 - 111 mmol/L   CO2 25 22 - 32 mmol/L   Glucose, Bld 89 70 - 99 mg/dL   BUN 14 6 - 20 mg/dL   Creatinine, Ser 4.691.01 0.61 - 1.24 mg/dL   Calcium 9.3 8.9 - 62.910.3 mg/dL   Total Protein 7.4 6.5 - 8.1 g/dL   Albumin 4.3 3.5 - 5.0 g/dL   AST 18 15 - 41 U/L   ALT 15 0 - 44 U/L   Alkaline Phosphatase 51 38 - 126 U/L   Total Bilirubin 0.6 0.3 - 1.2 mg/dL   GFR calc non Af Amer >60 >60 mL/min   GFR calc Af Amer >60 >60 mL/min   Anion gap 10 5 - 15  Ethanol  Result Value Ref Range   Alcohol, Ethyl (B) 75 (H) <10 mg/dL  cbc  Result Value Ref Range   WBC 6.5 3.8 - 10.6 K/uL   RBC 5.05 4.40 - 5.90 MIL/uL   Hemoglobin 16.5 13.0 - 18.0 g/dL   HCT 52.847.5 41.340.0 - 24.452.0 %   MCV 94.1 80.0 - 100.0 fL   MCH 32.7 26.0 - 34.0 pg   MCHC 34.7 32.0 - 36.0 g/dL   RDW 01.013.7 27.211.5 - 53.614.5 %   Platelets 213 150 - 440 K/uL  Urine Drug Screen, Qualitative  Result Value Ref Range   Tricyclic, Ur Screen NONE DETECTED NONE DETECTED   Amphetamines, Ur Screen NONE  DETECTED NONE DETECTED   MDMA (Ecstasy)Ur Screen NONE DETECTED NONE DETECTED   Cocaine Metabolite,Ur [place] NONE DETECTED NONE DETECTED   Opiate, Ur Screen NONE DETECTED NONE DETECTED   Phencyclidine (PCP) Ur S NONE DETECTED NONE DETECTED   Cannabinoid 50 Ng, Ur  POSITIVE (A) NONE DETECTED   Barbiturates, Ur Screen (A) NONE DETECTED    Result not available. Reagent lot number recalled by manufacturer.   Benzodiazepine, Ur Scrn NONE DETECTED NONE DETECTED   Methadone Scn, Ur NONE DETECTED NONE DETECTED

## 2017-08-18 ENCOUNTER — Other Ambulatory Visit: Payer: Self-pay

## 2017-08-18 ENCOUNTER — Emergency Department
Admission: EM | Admit: 2017-08-18 | Discharge: 2017-08-18 | Disposition: A | Discharge status: Other Acute Inpatient Hospital | Payer: Self-pay | Attending: Emergency Medicine | Admitting: Emergency Medicine

## 2017-08-18 ENCOUNTER — Inpatient Hospital Stay
Admission: AD | Admit: 2017-08-18 | Discharge: 2017-08-22 | DRG: 885 | Disposition: A | Discharge status: Home / Self Care | Payer: No Typology Code available for payment source | Attending: Psychiatry | Admitting: Psychiatry

## 2017-08-18 ENCOUNTER — Encounter: Payer: Self-pay | Admitting: Emergency Medicine

## 2017-08-18 DIAGNOSIS — K2970 Gastritis, unspecified, without bleeding: Secondary | ICD-10-CM | POA: Diagnosis present

## 2017-08-18 DIAGNOSIS — F1021 Alcohol dependence, in remission: Secondary | ICD-10-CM | POA: Diagnosis present

## 2017-08-18 DIAGNOSIS — T424X2A Poisoning by benzodiazepines, intentional self-harm, initial encounter: Secondary | ICD-10-CM | POA: Diagnosis present

## 2017-08-18 DIAGNOSIS — F4325 Adjustment disorder with mixed disturbance of emotions and conduct: Secondary | ICD-10-CM | POA: Diagnosis present

## 2017-08-18 DIAGNOSIS — K2920 Alcoholic gastritis without bleeding: Secondary | ICD-10-CM | POA: Diagnosis present

## 2017-08-18 DIAGNOSIS — F1220 Cannabis dependence, uncomplicated: Secondary | ICD-10-CM | POA: Diagnosis present

## 2017-08-18 DIAGNOSIS — F17210 Nicotine dependence, cigarettes, uncomplicated: Secondary | ICD-10-CM | POA: Diagnosis present

## 2017-08-18 DIAGNOSIS — F322 Major depressive disorder, single episode, severe without psychotic features: Principal | ICD-10-CM | POA: Diagnosis present

## 2017-08-18 DIAGNOSIS — F329 Major depressive disorder, single episode, unspecified: Secondary | ICD-10-CM | POA: Insufficient documentation

## 2017-08-18 DIAGNOSIS — T424X1A Poisoning by benzodiazepines, accidental (unintentional), initial encounter: Secondary | ICD-10-CM | POA: Diagnosis present

## 2017-08-18 DIAGNOSIS — F1290 Cannabis use, unspecified, uncomplicated: Secondary | ICD-10-CM | POA: Diagnosis present

## 2017-08-18 DIAGNOSIS — F1020 Alcohol dependence, uncomplicated: Secondary | ICD-10-CM | POA: Diagnosis present

## 2017-08-18 DIAGNOSIS — Z915 Personal history of self-harm: Secondary | ICD-10-CM

## 2017-08-18 DIAGNOSIS — F339 Major depressive disorder, recurrent, unspecified: Secondary | ICD-10-CM | POA: Diagnosis present

## 2017-08-18 DIAGNOSIS — F17200 Nicotine dependence, unspecified, uncomplicated: Secondary | ICD-10-CM | POA: Diagnosis present

## 2017-08-18 DIAGNOSIS — Y905 Blood alcohol level of 100-119 mg/100 ml: Secondary | ICD-10-CM | POA: Diagnosis present

## 2017-08-18 DIAGNOSIS — T50904A Poisoning by unspecified drugs, medicaments and biological substances, undetermined, initial encounter: Secondary | ICD-10-CM | POA: Insufficient documentation

## 2017-08-18 LAB — URINE DRUG SCREEN, QUALITATIVE (ARMC ONLY)
AMPHETAMINES, UR SCREEN: NOT DETECTED
BENZODIAZEPINE, UR SCRN: NOT DETECTED
Cannabinoid 50 Ng, Ur ~~LOC~~: POSITIVE — AB
Cocaine Metabolite,Ur ~~LOC~~: NOT DETECTED
MDMA (Ecstasy)Ur Screen: NOT DETECTED
Methadone Scn, Ur: NOT DETECTED
Opiate, Ur Screen: NOT DETECTED
PHENCYCLIDINE (PCP) UR S: NOT DETECTED
TRICYCLIC, UR SCREEN: NOT DETECTED

## 2017-08-18 LAB — ETHANOL: ALCOHOL ETHYL (B): 117 mg/dL — AB (ref <10)

## 2017-08-18 LAB — CBC
HCT: 50.9 % (ref 40.0–52.0)
Hemoglobin: 17.9 g/dL (ref 13.0–18.0)
MCH: 32.8 pg (ref 26.0–34.0)
MCHC: 35.2 g/dL (ref 32.0–36.0)
MCV: 93.1 fL (ref 80.0–100.0)
PLATELETS: 246 10*3/uL (ref 150–440)
RBC: 5.46 MIL/uL (ref 4.40–5.90)
RDW: 13.6 % (ref 11.5–14.5)
WBC: 6.8 10*3/uL (ref 3.8–10.6)

## 2017-08-18 LAB — COMPREHENSIVE METABOLIC PANEL
ALT: 18 U/L (ref 0–44)
AST: 21 U/L (ref 15–41)
Albumin: 5 g/dL (ref 3.5–5.0)
Alkaline Phosphatase: 52 U/L (ref 38–126)
Anion gap: 10 (ref 5–15)
BILIRUBIN TOTAL: 0.8 mg/dL (ref 0.3–1.2)
BUN: 11 mg/dL (ref 6–20)
CO2: 23 mmol/L (ref 22–32)
Calcium: 9.3 mg/dL (ref 8.9–10.3)
Chloride: 107 mmol/L (ref 98–111)
Creatinine, Ser: 1.01 mg/dL (ref 0.61–1.24)
Glucose, Bld: 95 mg/dL (ref 70–99)
POTASSIUM: 4 mmol/L (ref 3.5–5.1)
Sodium: 140 mmol/L (ref 135–145)
TOTAL PROTEIN: 8.1 g/dL (ref 6.5–8.1)

## 2017-08-18 LAB — SALICYLATE LEVEL

## 2017-08-18 LAB — ACETAMINOPHEN LEVEL: Acetaminophen (Tylenol), Serum: <10 ug/mL — ABNORMAL LOW (ref 10–30)

## 2017-08-18 MED ORDER — ALUM & MAG HYDROXIDE-SIMETH 200-200-20 MG/5ML PO SUSP: 30.0000 mL | ORAL | Status: DC | PRN

## 2017-08-18 MED ORDER — TRAZODONE HCL 100 MG PO TABS
100.0000 mg | ORAL_TABLET | Freq: Every evening | ORAL | Status: DC | PRN
  Administered 2017-08-19 – 2017-08-21 (×3): 100 mg via ORAL
  Filled 2017-08-18 (×4): qty 1

## 2017-08-18 MED ORDER — PANTOPRAZOLE SODIUM 40 MG PO TBEC: 40.0000 mg | DELAYED_RELEASE_TABLET | Freq: Two times a day (BID) | ORAL | Status: DC

## 2017-08-18 MED ORDER — HYDROXYZINE HCL 25 MG PO TABS: 50.0000 mg | ORAL_TABLET | Freq: Four times a day (QID) | ORAL | Status: DC | PRN

## 2017-08-18 MED ORDER — HYDROXYZINE HCL 50 MG PO TABS
50.0000 mg | ORAL_TABLET | Freq: Four times a day (QID) | ORAL | Status: DC | PRN
  Administered 2017-08-19: 50 mg via ORAL
  Filled 2017-08-18 (×2): qty 1

## 2017-08-18 MED ORDER — ACETAMINOPHEN 325 MG PO TABS: 650.0000 mg | ORAL_TABLET | Freq: Four times a day (QID) | ORAL | Status: DC | PRN

## 2017-08-18 MED ORDER — MAGNESIUM HYDROXIDE 400 MG/5ML PO SUSP: 30.0000 mL | Freq: Every day | ORAL | Status: DC | PRN

## 2017-08-18 MED ORDER — PANTOPRAZOLE SODIUM 40 MG PO TBEC
40.0000 mg | DELAYED_RELEASE_TABLET | Freq: Two times a day (BID) | ORAL | Status: DC
  Administered 2017-08-19 – 2017-08-22 (×6): 40 mg via ORAL
  Filled 2017-08-18 (×7): qty 1

## 2017-08-18 MED ORDER — TRAZODONE HCL 100 MG PO TABS: 100.0000 mg | ORAL_TABLET | Freq: Every evening | ORAL | Status: DC | PRN

## 2017-08-18 NOTE — BH Assessment (Signed)
[facility] by Dr. Toni Amendlapacs.  Attending Physician will be Dr. Flora Lipps'Neal.   Patient has been assigned to room 306[facility]B, by [facility] Charge Nurse T'Ywan.   ER staff is aware of the admission:  Misty StanleyLisa, ER Kathrynn SpeedSectary   Dr. Lamont Snowballifenbark, ER MD   Geralynn OchsJadeka, Patient's Nurse   Dedra SkeensGwen, Patient Access.

## 2017-08-18 NOTE — Tx Team (Signed)
Initial Treatment Plan 08/18/2017 5:55 PM [name] WUJ:[id number]    PATIENT STRESSORS: Loss of relationship Marital or family conflict Occupational concerns Substance abuse   PATIENT STRENGTHS: Ability for insight Active sense of humor Capable of independent living Communication skills General fund of knowledge   PATIENT IDENTIFIED PROBLEMS: Ineffective coping skills 08/18/17  Unstable Mood 08/18/17  Suicidal Ideations 08/18/17                 DISCHARGE CRITERIA:  Ability to meet basic life and health needs Adequate post-discharge living arrangements Motivation to continue treatment in a less acute level of care  PRELIMINARY DISCHARGE PLAN: Attend aftercare/continuing care group Return to previous living arrangement Return to previous work or school arrangements  PATIENT/FAMILY INVOLVEMENT: This treatment plan has been presented to and reviewed with the patient, Keith Dudley.  The patient and family have been given the opportunity to ask questions and make suggestions.  Berkley HarveySlade I Ksean Vale, RN 08/18/2017, 5:55 PM
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
## 2017-08-18 NOTE — ED Triage Notes (Signed)
Brought by [facility] and accompanied by [facility].  Took unknown amount of day and night.  10-13 pills today.   [facility] states they are taking out IVC papers now.  Per ems vss. Patient is alert and coopertive.  He also vomited on arrival to ED.

## 2017-08-18 NOTE — ED Provider Notes (Signed)
[facility] Emergency Department Provider Note  ____________________________________________  Time seen: Approximately 2:38 PM  I have reviewed the triage vital signs and the nursing notes.   HISTORY  Chief Complaint Drug Overdose    HPI Keith Dudley is a 22 y.o. male with a history of alcohol abuse and suicide attempt 2 days ago by benzodiazepine overdose  who was brought to the ED today due to medication overdose.  Patient reports that he and his boyfriend that he lives with broke up 2 days ago.  He is very upset by this.  After his overdose at that time he was seen in the ED and appeared to return to normal mood and was calm and cooperative per the psychiatry note.  Plan was to discharge him in follow-up with [facility].  However, when he woke up this morning, the patient unexpectedly had many messages from people who had discovered his homosexuality via social media.  The messages were negative and offensive and disturbing to him, making him very upset.  He reports that he went to a pharmacy, grabbed the first box of pills he could find and took 13 of them in the bathroom.  He now feels remorseful about his reaction.  He denies suicidal intent or wanting to die.  Denies HI or hallucinations.  Denies any medical complaints.  No nausea or abdominal pain currently.  No chest pain or shortness of breath.  He did vomit once with EMS.     Past Medical History:  Diagnosis Date  . Alcoholic gastritis with bleeding      Patient Active Problem List   Diagnosis Date Noted  . Alcohol abuse 08/17/2017  . 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 Gastritis 08/17/2017  . Severe major depression, single episode, without psychotic features (HCC) 08/16/2017  . Benzodiazepine overdose 08/16/2017     History reviewed. No pertinent surgical history.   Prior to Admission medications   Not on File  None   Allergies Patient has no  known allergies.   No family history on file.  Social History Social History   Tobacco Use  . Smoking status: Current Some Day Smoker    Types: Cigarettes  . Smokeless tobacco: Never Used  Substance Use Topics  . Alcohol use: Yes    Comment: 0.5 fifth of liquor 08/16/17  . Drug use: Not Currently  Denies drug use.  Last alcohol was 2 AM today.  Review of Systems  Constitutional:   No fever or chills.  ENT:   No sore throat. No rhinorrhea. Cardiovascular:   No chest pain or syncope. Respiratory:   No dyspnea or cough. Gastrointestinal:   Negative for abdominal pain, vomiting and diarrhea.  Musculoskeletal:   Negative for focal pain or swelling All other systems reviewed and are negative except as documented above in ROS and HPI.  ____________________________________________   PHYSICAL EXAM:  VITAL SIGNS: ED Triage Vitals  Enc Vitals Group     BP 08/18/17 1304 126/85     Pulse Rate 08/18/17 1304 (!) 107     Resp 08/18/17 1304 16     Temp 08/18/17 1304 98.9 F (37.2 C)     Temp Source 08/18/17 1304 Oral     SpO2 08/18/17 1304 100 %     Weight 08/18/17 1259 160 lb (72.6 kg)     Height 08/18/17 1259 5\' 10"  (1.778 m)     Head Circumference --      Peak Flow --  Pain Score 08/18/17 1259 0     Pain Loc --      Pain Edu? --      Excl. in GC? --     Vital signs reviewed, nursing assessments reviewed.   Constitutional:   Alert and oriented. Non-toxic appearance. Eyes:   Conjunctivae are normal. EOMI. PERRL. ENT      Head:   Normocephalic and atraumatic.      Nose:   No congestion/rhinnorhea.       Mouth/Throat:   MMM, no pharyngeal erythema. No peritonsillar mass.       Neck:   No meningismus. Full ROM. Hematological/Lymphatic/Immunilogical:   No cervical lymphadenopathy. Cardiovascular:   RRR. Symmetric bilateral radial and DP pulses.  No murmurs. Cap refill less than 2 seconds. Respiratory:   Normal respiratory effort without tachypnea/retractions. Breath  sounds are clear and equal bilaterally. No wheezes/rales/rhonchi. Gastrointestinal:   Soft and nontender. Non distended. There is no CVA tenderness.  No rebound, rigidity, or guarding.  Normoactive bowel sounds Musculoskeletal:   Normal range of motion in all extremities. No joint effusions.  No lower extremity tenderness.  No edema. Neurologic:   Normal speech and language.  Motor grossly intact. No acute focal neurologic deficits are appreciated.  Skin:    Skin is warm, dry and intact. No rash noted.  No petechiae, purpura, or bullae.  No wounds  ____________________________________________    LABS (pertinent positives/negatives) (all labs ordered are listed, but only abnormal results are displayed) Labs Reviewed  ETHANOL - Abnormal; Notable for the following components:      Result Value   Alcohol, Ethyl (B) 117 (*)    All other components within normal limits  ACETAMINOPHEN LEVEL - Abnormal; Notable for the following components:   Acetaminophen (Tylenol), Serum <10 (*)    All other components within normal limits  COMPREHENSIVE METABOLIC PANEL  SALICYLATE LEVEL  CBC  URINE DRUG SCREEN, QUALITATIVE ([facility] ONLY)   ____________________________________________   EKG    ____________________________________________    RADIOLOGY  No results found.  ____________________________________________   PROCEDURES Procedures  ____________________________________________    CLINICAL IMPRESSION / ASSESSMENT AND PLAN / ED COURSE  Pertinent labs & imaging results that were available during my care of the patient were reviewed by me and considered in my medical decision making (see chart for details).    Package includes B vitamins, vitamin D, L theanine, 5 HTP, valerian root, grape extract, melatonin.  Clinical Course as of Aug 18 1436  Fri Aug 18, 2017  1359 Patient brought package rhythm of the medication that he took.  Mostly vitamins and nutritional supplements but also  contains melatonin.  No Tylenol or salicylates.   [PS]    Clinical Course User Index [PS] Sharman CheekStafford, Nussen Pullin, MD   Toxicology database reviewed, pt may be expected to have some nausea, gastritis, dizziness, or drowsiness from melatonin.  Other ingredients not significant. Medically stable. Will f/u psych recommendations given repeat incident.  ____________________________________________   FINAL CLINICAL IMPRESSION(S) / ED DIAGNOSES    Final diagnoses:  Drug overdose, undetermined intent, initial encounter     ED Discharge Orders    None      Portions of this note were generated with dragon dictation software. Dictation errors may occur despite best attempts at proofreading.    Sharman CheekStafford, Mcdonald Reiling, MD 08/18/17 [phone]

## 2017-08-18 NOTE — ED Notes (Addendum)
Charge RN note: Patient here EMS [facility], A&O, ambulatory.  Active ingredients of medication shown to Dr. Scotty CourtStafford with no immediate concern.  Pt stable to lobby with EMS.  First nurse notified.

## 2017-08-18 NOTE — Progress Notes (Signed)
D: Received patient from ED Patient skin assessment completed with T'yawn RN, skin is intact, no contraband found. Patient is low fall risk. Patient denies SI/HI/AVH. Patient involuntarily admitted for two previous suicide attempts after resent loss of significant relationship.   A: Patient oriented to unit/room/call light. Patient was offered support and encouragement. Patient was encourage to attend groups, participate in unit activities and continue with plan of care. Q x 15 minute observation checks were completed for safety.   R: Patient has no complaints at this time. Patient is receptive to treatment and safety maintained on unit.

## 2017-08-18 NOTE — ED Notes (Signed)
[facility] Patient appropriate and cooperative, Denies SI/HI AVH. Vital signs taken. NAD noted.

## 2017-08-19 ENCOUNTER — Encounter: Payer: Self-pay | Admitting: Psychiatry

## 2017-08-19 DIAGNOSIS — F1220 Cannabis dependence, uncomplicated: Secondary | ICD-10-CM | POA: Diagnosis present

## 2017-08-19 DIAGNOSIS — F322 Major depressive disorder, single episode, severe without psychotic features: Principal | ICD-10-CM

## 2017-08-19 DIAGNOSIS — F1290 Cannabis use, unspecified, uncomplicated: Secondary | ICD-10-CM | POA: Diagnosis present

## 2017-08-19 DIAGNOSIS — F17200 Nicotine dependence, unspecified, uncomplicated: Secondary | ICD-10-CM | POA: Diagnosis present

## 2017-08-19 LAB — HEMOGLOBIN A1C
Hgb A1c MFr Bld: 5.6 % (ref 4.8–5.6)
Mean Plasma Glucose: 114.02 mg/dL

## 2017-08-19 LAB — LIPID PANEL
Cholesterol: 177 mg/dL (ref 0–200)
HDL: 82 mg/dL (ref >40)
LDL Cholesterol: 80 mg/dL (ref 0–99)
TRIGLYCERIDES: 73 mg/dL (ref <150)
Total CHOL/HDL Ratio: 2.2 RATIO
VLDL: 15 mg/dL (ref 0–40)

## 2017-08-19 LAB — TSH: TSH: 1.019 u[IU]/mL (ref 0.350–4.500)

## 2017-08-19 MED ORDER — CHLORDIAZEPOXIDE HCL 10 MG PO CAPS
10.0000 mg | ORAL_CAPSULE | Freq: Four times a day (QID) | ORAL | Status: AC
  Administered 2017-08-19 – 2017-08-22 (×12): 10 mg via ORAL
  Filled 2017-08-19 (×6): qty 1
  Filled 2017-08-19: qty 2
  Filled 2017-08-19 (×5): qty 1

## 2017-08-19 MED ORDER — MIRTAZAPINE 15 MG PO TABS
15.0000 mg | ORAL_TABLET | Freq: Every day | ORAL | Status: DC
  Administered 2017-08-19 – 2017-08-21 (×3): 15 mg via ORAL
  Filled 2017-08-19 (×3): qty 1

## 2017-08-19 NOTE — [facility] Group Notes (Signed)
Date/Time:  08/19/2017 1PM   Type of Therapy and Topic:  Group Therapy:  Healthy and Unhealthy Supports   Participation Level:  Active    Description of Group:  Patients in this group were introduced to the idea of adding a variety of healthy supports to address the various needs in their lives.Patients discussed what additional healthy supports could be helpful in their recovery and wellness after discharge in order to prevent future hospitalizations.   An emphasis was placed on using counselor, doctor, therapy groups, 12-step groups, and problem-specific support groups to expand supports.  They also worked as a group on developing a specific plan for several patients to deal with unhealthy supports through boundary-setting, psychoeducation with loved ones, and even termination of relationships.    Therapeutic Goals:               1)  discuss importance of adding supports to stay well once out of the hospital             2)  compare healthy versus unhealthy supports and identify some examples of each             3)  generate ideas and descriptions of healthy supports that can be added             4)  offer mutual support about how to address unhealthy supports             5)  encourage active participation in and adherence to discharge plan                Summary of Patient Progress:  The patient stated that current healthy supports in his life are family members while current unhealthy supports include fake friends and alcohol.  The patient expressed a willingness to add keeping a better mindselt as support(s) to help in his recovery journey.     Therapeutic Modalities:   Motivational Interviewing Brief Solution-Focused Therapy   Johny ShearsCassandra  [place] Dod[place], LCSW 7/13/20192:11 PM

## 2017-08-19 NOTE — Plan of Care (Signed)
Patient remained in bed most of shift,resting in bed. No behavioral issues to report at this time.

## 2017-08-19 NOTE — [facility] Suicide Risk Assessment (Signed)
[facility] INPATIENT:  Family/Significant Other Suicide Prevention Education  Suicide Prevention Education:  Patient Refusal for Family/Significant Other Suicide Prevention Education: The patient Keith Dudley has refused to provide written consent for family/significant other to be provided Family/Significant Other Suicide Prevention Education during admission and/or prior to discharge.  Physician notified.  Johny ShearsCassandra  Masie Bermingham 08/19/2017, 9:50 AM

## 2017-08-19 NOTE — [facility] Group Notes (Signed)
[facility] Group Notes:  (Nursing/MHT/Case Management/Adjunct)  Date:  08/19/2017  Time:  10:40 PM  Type of Therapy:  Group Therapy  Participation Level:  Active  Participation Quality:  Attentive  Affect:  Appropriate  Cognitive:  Alert  Insight:  Appropriate  Engagement in Group:  Engaged  Modes of Intervention:  Problem-solving  Summary of Progress/Problems: MHT addressed the messes that were left behind and encouraged patients to clean up after themselves. MHT reviewed visiting hours and the number of people allowed on the unit at one time. MHT informed patients of the phone hours. MHT informed patients of the time the day room would be shut down. MHT informed patients of wake-up call at 6am for vitals. MHT answered questions and concerns about the rules and expectations. MHT had patients introduce self and tell group what type of animal they would choose to be and why. MHT processed with group about decision making. MHT reviewed list of things to do when making decisions. MHT encouraged group to utilize the social workers while in group to address concerns and develop plans to address issue when released. MHT informed group they could be linked to providers once discharged. MHT informed group that IPRS funds were available for mental health services. MHT informed group they need to recognize a need exists to participate in treatment and medication management. Favian was engaged throughout group. Taye did not share in group, but asked to talk afterwards. Clayton shared he had attempted to kill himself twice because he had a break-up with his boyfriend. Jazmine stated he had come out as gay. Tavaras stated he made a mistake and thinks he likes women. MHT explained he had a decision to make, but encouraged Vong to figure out what makes him happy. MHT explained he could not live his life trying to please other people. Kalev stated that's what he had been doing. MHT informed Quade he  would not make it to his happiness trying to please everybody else. Caleel stated he needed to hear that. Jinger NeighborsKeith D Jessica Seidman 08/19/2017, 10:40 PM

## 2017-08-19 NOTE — Progress Notes (Addendum)
[facility]  08/20/2017 7:53 AM Sol Passerevin Dehaven  MRN:  [id number]  Subjective:    [name] feels stable. Denies suicidal ideation, mood has improved, affect is bright. Had visitors yesterday, including police officer who brought him to the hospital. He spoke with his [place] [facility] support. He accepted medications and tolerates them well. There are no symptoms of alcohol withdrawal. The patient is able to formulate a sound discharge plan.  I was hoping to discharge him tomorrow after he makes connection with Keith Dudley from [facility] to strengthen his resolve to continue in mental health and substance abuse treatment. However, I spoke extensively with both of his parents who are extremely concerned and convinced that the patient needs more stabilization. Patient requested family meeting.  Principal Problem: Severe major depression, single episode, without psychotic features (HCC) Diagnosis:   Patient Active Problem List   Diagnosis Date Noted  . Severe major depression, single episode, without psychotic features (HCC) [F32.2] 08/16/2017    Priority: High  . Cannabis use disorder, moderate, dependence (HCC) [F12.20] 08/19/2017  . Tobacco use disorder [F17.200] 08/19/2017  . Alcohol use disorder, moderate, dependence (HCC) [F10.20] 08/17/2017  . Adjustment disorder with mixed disturbance of emotions and conduct [F43.25] 08/17/2017  . Gastritis [K29.70] 08/17/2017  . Benzodiazepine overdose [T42.4X1A] 08/16/2017   Total Time spent with patient: 20 minutes  Past Psychiatric History: none  Past Medical History:  Past Medical History:  Diagnosis Date  . Alcoholic gastritis with bleeding    History reviewed. No pertinent surgical history. Family History: History reviewed. No pertinent family history. Family Psychiatric  History: none Social History:  Social History   Substance and Sexual Activity  Alcohol Use Yes   Comment: 0.5 fifth of liquor 08/16/17      Social History   Substance and Sexual Activity  Drug Use Not Currently    Social History   Socioeconomic History  . Marital status: Single    Spouse name: Not on file  . Number of children: Not on file  . Years of education: Not on file  . Highest education level: Not on file  Occupational History  . Not on file  Social Needs  . Financial resource strain: Not on file  . Food insecurity:    Worry: Not on file    Inability: Not on file  . Transportation needs:    Medical: Not on file    Non-medical: Not on file  Tobacco Use  . Smoking status: Current Some Day Smoker    Types: Cigarettes  . Smokeless tobacco: Never Used  Substance and Sexual Activity  . Alcohol use: Yes    Comment: 0.5 fifth of liquor 08/16/17  . Drug use: Not Currently  . Sexual activity: Yes  Lifestyle  . Physical activity:    Days per week: Not on file    Minutes per session: Not on file  . Stress: Not on file  Relationships  . Social connections:    Talks on phone: Not on file    Gets together: Not on file    Attends religious service: Not on file    Active member of club or organization: Not on file    Attends meetings of clubs or organizations: Not on file    Relationship status: Not on file  Other Topics Concern  . Not on file  Social History Narrative  . Not on file   Additional Social History:  Sleep: Fair  Appetite:  Fair  Current Medications: Current Facility-Administered Medications  Medication Dose Route Frequency Provider Last Rate Last Dose  . acetaminophen (TYLENOL) tablet 650 mg  650 mg Oral Q6H PRN Clapacs, John T, MD      . alum & mag hydroxide-simeth (MAALOX/MYLANTA) 200-200-20 MG/5ML suspension 30 mL  30 mL Oral Q4H PRN Clapacs, John T, MD      . chlordiazePOXIDE (LIBRIUM) capsule 10 mg  10 mg Oral QID Dianne Whelchel B, MD   10 mg at 08/19/17 2046  . hydrOXYzine (ATARAX/VISTARIL) tablet 50 mg  50 mg Oral Q6H PRN Clapacs, Jackquline Denmark, MD   50 mg at 08/19/17 1224  . magnesium hydroxide (MILK OF MAGNESIA) suspension 30 mL  30 mL Oral Daily PRN Clapacs, John T, MD      . mirtazapine (REMERON) tablet 15 mg  15 mg Oral QHS Fatemah Pourciau B, MD   15 mg at 08/19/17 2046  . pantoprazole (PROTONIX) EC tablet 40 mg  40 mg Oral BID Clapacs, Jackquline Denmark, MD   40 mg at 08/19/17 1640  . traZODone (DESYREL) tablet 100 mg  100 mg Oral QHS PRN Clapacs, Jackquline Denmark, MD   100 mg at 08/19/17 2046    Lab Results:  Results for orders placed or performed during the hospital encounter of 08/18/17 (from the past 48 hour(s))  Hemoglobin A1c     Status: None   Collection Time: 08/18/17  1:18 PM  Result Value Ref Range   Hgb A1c MFr Bld 5.6 4.8 - 5.6 %    Comment: (NOTE) Pre diabetes:          5.7%-6.4% Diabetes:              >6.4% Glycemic control for   <7.0% adults with diabetes    Mean Plasma Glucose 114.02 mg/dL    Comment: Performed at [facility], [address], [place], [place] [address]  Lipid panel     Status: None   Collection Time: 08/18/17  1:18 PM  Result Value Ref Range   Cholesterol 177 0 - 200 mg/dL   Triglycerides 73 <657 mg/dL   HDL 82 >84 mg/dL   Total CHOL/HDL Ratio 2.2 RATIO   VLDL 15 0 - 40 mg/dL   LDL Cholesterol 80 0 - 99 mg/dL    Comment:        Total Cholesterol/HDL:CHD Risk Coronary Heart Disease Risk Table                     Men   Women  1/2 Average Risk   3.4   3.3  Average Risk       5.0   4.4  2 X Average Risk   9.6   7.1  3 X Average Risk  23.4   11.0        Use the calculated Patient Ratio above and the CHD Risk Table to determine the patient's CHD Risk.        ATP III CLASSIFICATION (LDL):  <100     mg/dL   Optimal  696-295  mg/dL   Near or Above                    Optimal  130-159  mg/dL   Borderline  284-132  mg/dL   High  >440     mg/dL   Very High Performed at [facility], [address], [place], [place] [address]  TSH     Status: None   Collection Time: 08/18/17   1:18 PM  Result Value Ref Range   TSH 1.019 0.350 - 4.500 uIU/mL    Comment: Performed by a 3rd Generation assay with a functional sensitivity of <=0.01 uIU/mL. Performed at [facility], [address] [place], [place] [address]     Blood Alcohol level:  Lab Results  Component Value Date   ETH 117 (H) 08/18/2017   ETH 75 (H) 08/16/2017    Metabolic Disorder Labs: Lab Results  Component Value Date   HGBA1C 5.6 08/18/2017   MPG 114.02 08/18/2017   No results found for: PROLACTIN Lab Results  Component Value Date   CHOL 177 08/18/2017   TRIG 73 08/18/2017   HDL 82 08/18/2017   CHOLHDL 2.2 08/18/2017   VLDL 15 08/18/2017   LDLCALC 80 08/18/2017    Physical Findings: AIMS: Facial and Oral Movements Muscles of Facial Expression: None, normal Lips and Perioral Area: None, normal Jaw: None, normal Tongue: None, normal,Extremity Movements Upper (arms, wrists, hands, fingers): None, normal Lower (legs, knees, ankles, toes): None, normal, Trunk Movements Neck, shoulders, hips: None, normal, Overall Severity Severity of abnormal movements (highest score from questions above): None, normal Incapacitation due to abnormal movements: None, normal Patient's awareness of abnormal movements (rate only patient's report): No Awareness, Dental Status Current problems with teeth and/or dentures?: No Does patient usually wear dentures?: No  CIWA:    COWS:     Musculoskeletal: Strength & Muscle Tone: within normal limits Gait & Station: normal Patient leans: N/A  Psychiatric Specialty Exam: Physical Exam  Nursing note and vitals reviewed. Psychiatric: He has a normal mood and affect. His speech is normal and behavior is normal. Thought content normal. Cognition and memory are normal. He expresses impulsivity.    Review of Systems  Neurological: Negative.   Psychiatric/Behavioral: Positive for substance abuse.  All other systems reviewed and are negative.   Blood  pressure 123/88, pulse (!) 103, temperature 97.9 F (36.6 C), temperature source Oral, resp. rate 18, height 5' 9.69" (1.77 m), weight 73 kg (161 lb), SpO2 100 %.Body mass index is 23.31 kg/m.  General Appearance: Casual  Eye Contact:  Good  Speech:  Clear and Coherent  Volume:  Normal  Mood:  Euthymic  Affect:  Appropriate  Thought Process:  Goal Directed and Descriptions of Associations: Intact  Orientation:  Full (Time, Place, and Person)  Thought Content:  WDL  Suicidal Thoughts:  No  Homicidal Thoughts:  No  Memory:  Immediate;   Fair Recent;   Fair Remote;   Fair  Judgement:  Poor  Insight:  Lacking  Psychomotor Activity:  Normal  Concentration:  Concentration: Fair and Attention Span: Fair  Recall:  [employer] of Knowledge:  Fair  Language:  Fair  Akathisia:  No  Handed:  Right  AIMS (if indicated):     Assets:  Communication Skills Desire for Improvement Housing Physical Health Resilience Social Support  ADL's:  Intact  Cognition:  WNL  Sleep:  Number of Hours: 7.45     Treatment Plan Summary: Daily contact with patient to assess and evaluate symptoms and progress in treatment and Medication management   Mr. Hink is a 22 year old male with no past psychiatric history admitted after overdose on medication in the context of relationship problems.   #Suicidal ideation -patient adamantly denies any thoughts intention or plans to hurt himself or others  #Mood -start Remeron 15 mg nightly  -open  to psychotherapy  #Substance abuse -positive for alcohol and cannabis -denies daily drinking -no symptoms of alcohol withdrawal, VS are stable -declines substance abuse treatment  #Alcoholic gastritis -continue Protonix  #Smoking cassation -nicotine patch was offered  #Disposition -discharge with family -follow up with [facility]    Kristine Linea, MD 08/20/2017, 7:53 AM

## 2017-08-19 NOTE — H&P (Signed)
Psychiatric Admission Assessment Adult  Patient Identification: Keith Dudley MRN:  [id number] Date of Evaluation:  08/19/2017 Chief Complaint:  DEPRESSION Principal Diagnosis: Severe major depression, single episode, without psychotic features (HCC) Diagnosis:   Patient Active Problem List   Diagnosis Date Noted  . Severe major depression, single episode, without psychotic features (HCC) [F32.2] 08/16/2017    Priority: High  . Cannabis use disorder, moderate, dependence (HCC) [F12.20] 08/19/2017  . Tobacco use disorder [F17.200] 08/19/2017  . Alcohol use disorder, moderate, dependence (HCC) [F10.20] 08/17/2017  . Adjustment disorder with mixed disturbance of emotions and conduct [F43.25] 08/17/2017  . Gastritis [K29.70] 08/17/2017  . Benzodiazepine overdose [T42.4X1A] 08/16/2017   History of Present Illness:   Keith Dudley is a 22 year old male with no past psychiatric history.  Chief complaint. "It was stupid."  History of present illness. Information was obtained from the patient and the chart. The patient was brought to the hospital after overdose on OTC medication in the context of relationship brake-up. He was in the ER 3 tmes since 7/10. First time with alcoholic gastritis after a night of drinking. Second, after overdose on Klonopin, that he received from a friend, with intent to die after boyfriend broke up with him.  He then returned following another overdose on OTC medication when he realized that there were unflattering comments on snap chat including information about his sexual orientation. The patient denies any problems with depression, anxiety or psychosis until last Sunday, when his problems started. He drinks alcohol and consumption escalated since Sunday. He has been drinking liquor. He smokes cannabis. He denies benzodiazepine use except for overdose.   Spoke with the mother at length. The patient has been calling family and friends telling them that he wants to die. They  believe he is at grave risk of overdose again. She is also concerned with his drinking. She believes that he has been   Past psychiatric history. Denies any. He made appointment at [facility] since problems started/  Family psychiatric history. None.  Social history. Works two jobs. He and his now ex-boyfriend recently rented an apartment together. The patient intends to keep it. He has supportive family.   Total Time spent with patient: 1 hour  Is the patient at risk to self? No.  Has the patient been a risk to self in the past 6 months? Yes.    Has the patient been a risk to self within the distant past? No.  Is the patient a risk to others? No.  Has the patient been a risk to others in the past 6 months? No.  Has the patient been a risk to others within the distant past? No.   Prior Inpatient Therapy:   Prior Outpatient Therapy:    Alcohol Screening: 1. How often do you have a drink containing alcohol?: 2 to 3 times a week 2. How many drinks containing alcohol do you have on a typical day when you are drinking?: 3 or 4 3. How often do you have six or more drinks on one occasion?: Monthly AUDIT-C Score: 6 4. How often during the last year have you found that you were not able to stop drinking once you had started?: Never 5. How often during the last year have you failed to do what was normally expected from you becasue of drinking?: Never 6. How often during the last year have you needed a first drink in the morning to get yourself going after a heavy drinking session?: Less than monthly 7. How  often during the last year have you had a feeling of guilt of remorse after drinking?: Daily or almost daily 8. How often during the last year have you been unable to remember what happened the night before because you had been drinking?: Never 9. Have you or someone else been injured as a result of your drinking?: No 10. Has a relative or friend or a doctor or another health worker been concerned  about your drinking or suggested you cut down?: No Alcohol Use Disorder Identification Test Final Score (AUDIT): 11 Intervention/Follow-up: Alcohol Education Substance Abuse History in the last 12 months:  Yes.   Consequences of Substance Abuse: Negative Previous Psychotropic Medications: No  Psychological Evaluations: No  Past Medical History:  Past Medical History:  Diagnosis Date  . Alcoholic gastritis with bleeding    History reviewed. No pertinent surgical history. Family History: History reviewed. No pertinent family history.  Tobacco Screening: Have you used any form of tobacco in the last 30 days? (Cigarettes, Smokeless Tobacco, Cigars, and/or Pipes): No Social History:  Social History   Substance and Sexual Activity  Alcohol Use Yes   Comment: 0.5 fifth of liquor 08/16/17     Social History   Substance and Sexual Activity  Drug Use Not Currently    Additional Social History:                           Allergies:  No Known Allergies Lab Results:  Results for orders placed or performed during the hospital encounter of 08/18/17 (from the past 48 hour(s))  Hemoglobin A1c     Status: None   Collection Time: 08/18/17  1:18 PM  Result Value Ref Range   Hgb A1c MFr Bld 5.6 4.8 - 5.6 %    Comment: (NOTE) Pre diabetes:          5.7%-6.4% Diabetes:              >6.4% Glycemic control for   <7.0% adults with diabetes    Mean Plasma Glucose 114.02 mg/dL    Comment: Performed at [facility], [address], [place], [place] [address]  Lipid panel     Status: None   Collection Time: 08/18/17  1:18 PM  Result Value Ref Range   Cholesterol 177 0 - 200 mg/dL   Triglycerides 73 <829 mg/dL   HDL 82 >56 mg/dL   Total CHOL/HDL Ratio 2.2 RATIO   VLDL 15 0 - 40 mg/dL   LDL Cholesterol 80 0 - 99 mg/dL    Comment:        Total Cholesterol/HDL:CHD Risk Coronary Heart Disease Risk Table                     Men   Women  1/2 Average Risk   3.4   3.3  Average  Risk       5.0   4.4  2 X Average Risk   9.6   7.1  3 X Average Risk  23.4   11.0        Use the calculated Patient Ratio above and the CHD Risk Table to determine the patient's CHD Risk.        ATP III CLASSIFICATION (LDL):  <100     mg/dL   Optimal  213-086  mg/dL   Near or Above  Optimal  130-159  mg/dL   Borderline  161-096  mg/dL   High  >045     mg/dL   Very High Performed at [facility], [address] [place], [place] [address]   TSH     Status: None   Collection Time: 08/18/17  1:18 PM  Result Value Ref Range   TSH 1.019 0.350 - 4.500 uIU/mL    Comment: Performed by a 3rd Generation assay with a functional sensitivity of <=0.01 uIU/mL. Performed at [facility], [address] [place], [place] [address]     Blood Alcohol level:  Lab Results  Component Value Date   ETH 117 (H) 08/18/2017   ETH 75 (H) 08/16/2017    Metabolic Disorder Labs:  Lab Results  Component Value Date   HGBA1C 5.6 08/18/2017   MPG 114.02 08/18/2017   No results found for: PROLACTIN Lab Results  Component Value Date   CHOL 177 08/18/2017   TRIG 73 08/18/2017   HDL 82 08/18/2017   CHOLHDL 2.2 08/18/2017   VLDL 15 08/18/2017   LDLCALC 80 08/18/2017    Current Medications: Current Facility-Administered Medications  Medication Dose Route Frequency Provider Last Rate Last Dose  . acetaminophen (TYLENOL) tablet 650 mg  650 mg Oral Q6H PRN Clapacs, John T, MD      . alum & mag hydroxide-simeth (MAALOX/MYLANTA) 200-200-20 MG/5ML suspension 30 mL  30 mL Oral Q4H PRN Clapacs, John T, MD      . hydrOXYzine (ATARAX/VISTARIL) tablet 50 mg  50 mg Oral Q6H PRN Clapacs, John T, MD      . magnesium hydroxide (MILK OF MAGNESIA) suspension 30 mL  30 mL Oral Daily PRN Clapacs, John T, MD      . pantoprazole (PROTONIX) EC tablet 40 mg  40 mg Oral BID Clapacs, John T, MD      . traZODone (DESYREL) tablet 100 mg  100 mg Oral QHS PRN Clapacs, Jackquline Denmark, MD        PTA Medications: No medications prior to admission.    Musculoskeletal: Strength & Muscle Tone: within normal limits Gait & Station: normal Patient leans: N/A  Psychiatric Specialty Exam: Physical Exam  Nursing note and vitals reviewed. Constitutional: He is oriented to person, place, and time. He appears well-developed and well-nourished.  HENT:  Head: Normocephalic and atraumatic.  Eyes: Pupils are equal, round, and reactive to light. Conjunctivae and EOM are normal.  Neck: Normal range of motion. Neck supple.  Cardiovascular: Normal rate, regular rhythm and normal heart sounds.  Respiratory: Effort normal and breath sounds normal.  GI: Soft. Bowel sounds are normal.  Musculoskeletal: Normal range of motion.  Neurological: He is alert and oriented to person, place, and time.  Skin: Skin is warm and dry.  Psychiatric: He has a normal mood and affect. His speech is normal and behavior is normal. Cognition and memory are normal. He expresses impulsivity.    Review of Systems  Neurological: Negative.   Psychiatric/Behavioral: Positive for substance abuse.  All other systems reviewed and are negative.   Blood pressure 125/76, pulse 64, temperature 97.7 F (36.5 C), temperature source Oral, resp. rate 18, height 5' 9.69" (1.77 m), weight 73 kg (161 lb), SpO2 100 %.Body mass index is 23.31 kg/m.  See SRA  Sleep:  Number of Hours: 7.5    Treatment Plan Summary: Daily contact with patient to assess and evaluate symptoms and progress in treatment and Medication management   Keith Dudley is a 22 year old male with no past psychiatric history admitted after overdose on medication in the context of relationship problems.   #Suicidal ideation -patient adamantly denies any thoughts intention or plans to hurt himself or others  #Mood -start Remeron 15 mg nightly  -open to psychotherapy  #Substance  abuse -positive for alcohol and cannabis -denies daily drinking -no symptoms of alcohol withdrawal, VS are stable -declines substance abuse treatment  #Alcoholic gastritis -continue Protonix  #Smoking cassation -nicotine patch was offered  #Disposition -discharge with family -follow up with [facility]   Observation Level/Precautions:  15 minute checks  Laboratory:  CBC Chemistry Profile UDS UA  Psychotherapy:    Medications:    Consultations:    Discharge Concerns:    Estimated LOS:  Other:     Physician Treatment Plan for Primary Diagnosis: Severe major depression, single episode, without psychotic features (HCC) Long Term Goal(s): Improvement in symptoms so as ready for discharge  Short Term Goals: Ability to identify changes in lifestyle to reduce recurrence of condition will improve, Ability to verbalize feelings will improve, Ability to disclose and discuss suicidal ideas, Ability to demonstrate self-control will improve, Ability to identify and develop effective coping behaviors will improve, Ability to maintain clinical measurements within normal limits will improve and Ability to identify triggers associated with substance abuse/mental health issues will improve  Physician Treatment Plan for Secondary Diagnosis: Principal Problem:   Severe major depression, single episode, without psychotic features (HCC) Active Problems:   Benzodiazepine overdose   Alcohol use disorder, moderate, dependence (HCC)   Gastritis   Cannabis use disorder, moderate, dependence (HCC)   Tobacco use disorder  Long Term Goal(s): Improvement in symptoms so as ready for discharge  Short Term Goals: Ability to identify changes in lifestyle to reduce recurrence of condition will improve, Ability to demonstrate self-control will improve and Ability to identify triggers associated with substance abuse/mental health issues will improve  I certify that inpatient services furnished can reasonably be  expected to improve the patient's condition.    Kristine LineaJolanta Esty Ahuja, MD 7/13/20199:12 AM

## 2017-08-19 NOTE — [facility] Suicide Risk Assessment (Signed)
[facility] Admission Suicide Risk Assessment   Nursing information obtained from:  Patient Demographic factors:  Male, Keith Dudley, lesbian, or bisexual orientation, Living alone Current Mental Status:  NA Loss Factors:  Loss of significant relationship Historical Factors:  Prior suicide attempts Risk Reduction Factors:  Employed  Total Time spent with patient: 1 hour Principal Problem: Severe major depression, single episode, without psychotic features (HCC) Diagnosis:   Patient Active Problem List   Diagnosis Date Noted  . Severe major depression, single episode, without psychotic features (HCC) [F32.2] 08/16/2017    Priority: High  . Cannabis use disorder, moderate, dependence (HCC) [F12.20] 08/19/2017  . Tobacco use disorder [F17.200] 08/19/2017  . Alcohol use disorder, moderate, dependence (HCC) [F10.20] 08/17/2017  . Adjustment disorder with mixed disturbance of emotions and conduct [F43.25] 08/17/2017  . Gastritis [K29.70] 08/17/2017  . Benzodiazepine overdose [T42.4X1A] 08/16/2017   Subjective Data: overdose.  Continued Clinical Symptoms:  Alcohol Use Disorder Identification Test Final Score (AUDIT): 11 The "Alcohol Use Disorders Identification Test", Guidelines for Use in Primary Care, Second Edition.  World Science writer [facility]). Score between 0-7:  no or low risk or alcohol related problems. Score between 8-15:  moderate risk of alcohol related problems. Score between 16-19:  high risk of alcohol related problems. Score 20 or above:  warrants further diagnostic evaluation for alcohol dependence and treatment.   CLINICAL FACTORS:   Depression:   Comorbid alcohol abuse/dependence Impulsivity Alcohol/Substance Abuse/Dependencies   Musculoskeletal: Strength & Muscle Tone: within normal limits Gait & Station: normal Patient leans: N/A  Psychiatric Specialty Exam: Physical Exam  Nursing note and vitals reviewed. Psychiatric: He has a normal mood and affect. His speech is  normal and behavior is normal. Thought content normal. Cognition and memory are normal. He expresses impulsivity.    Review of Systems  Neurological: Negative.   Psychiatric/Behavioral: Positive for substance abuse.  All other systems reviewed and are negative.   Blood pressure 125/76, pulse 64, temperature 97.7 F (36.5 C), temperature source Oral, resp. rate 18, height 5' 9.69" (1.77 m), weight 73 kg (161 lb), SpO2 100 %.Body mass index is 23.31 kg/m.  General Appearance: Casual  Eye Contact:  Good  Speech:  Clear and Coherent  Volume:  Normal  Mood:  Euthymic  Affect:  Appropriate  Thought Process:  Goal Directed and Descriptions of Associations: Intact  Orientation:  Full (Time, Place, and Person)  Thought Content:  WDL  Suicidal Thoughts:  No  Homicidal Thoughts:  No  Memory:  Immediate;   Fair Recent;   Fair Remote;   Fair  Judgement:  Impaired  Insight:  Present  Psychomotor Activity:  Normal  Concentration:  Concentration: Fair and Attention Span: Fair  Recall:  [employer] of Knowledge:  Fair  Language:  Fair  Akathisia:  No  Handed:  Right  AIMS (if indicated):     Assets:  Communication Skills Desire for Improvement Financial Resources/Insurance Housing Physical Health Resilience Social Support Transportation Vocational/Educational  ADL's:  Intact  Cognition:  WNL  Sleep:  Number of Hours: 7.5      COGNITIVE FEATURES THAT CONTRIBUTE TO RISK:  None    SUICIDE RISK:   Minimal: No identifiable suicidal ideation.  Patients presenting with no risk factors but with morbid ruminations; may be classified as minimal risk based on the severity of the depressive symptoms  PLAN OF CARE: hospital admission, medication management, substance abuse counseling, discharge planning.  [name] is a 22 year old male with no past psychiatric history admitted after  overdose on medication in the context of relationship problems.   #Suicidal ideation -patient adamantly  denies any thoughts intention or plans to hurt himself or others  #Mood -patient not interested on pharmacotherapy -open to psychotherapy  #ASubstance abuse -positive for alcohol and cannabis -denies daily drinking -no symptoms of alcohol withdrawal  #Alcoholic gastritis -continue Protonix  #Smoking cassation -nicotine patch was offered  #Disposition -discharge with family -follow up with [facility]   I certify that inpatient services furnished can reasonably be expected to improve the patient's condition.   Kristine LineaJolanta Dailon Sheeran, MD 08/19/2017, 9:04 AM
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
## 2017-08-19 NOTE — Plan of Care (Signed)
Patient is alert and oriented, Denies SI, HI and AVH. Patient is pleasant and cooperative with medication and groups. Patient excited about possible discharge today. Patient recently went through a break up and stated he should have never took medications. Patient affect is bright. No complaints at this time from patient. Nurse will continue to monitor. Problem: Education: Goal: Knowledge of [facility] Education information/materials will improve Outcome: Progressing Goal: Emotional status will improve Outcome: Progressing Goal: Mental status will improve Outcome: Progressing Goal: Verbalization of understanding the information provided will improve Outcome: Progressing   Problem: Safety: Goal: Ability to disclose and discuss suicidal ideas will improve Outcome: Progressing Goal: Ability to identify and utilize support systems that promote safety will improve Outcome: Progressing

## 2017-08-20 NOTE — Plan of Care (Signed)
Pt continues to progress towards goals and d/c. RN will continue to monitor.  

## 2017-08-20 NOTE — [facility] Group Notes (Signed)
LCSW Group Therapy Note   08/20/2017 1:15pm   Type of Therapy and Topic:  Group Therapy:  Positive Affirmations   Participation Level:  Active  Description of Group: This group addressed positive affirmation toward self and others. Patients went around the room and identified two positive things about themselves and two positive things about a peer in the room. Patients reflected on how it felt to share something positive with others, to identify positive things about themselves, and to hear positive things from others. Patients were encouraged to have a daily reflection of positive characteristics or circumstances.  Therapeutic Goals 1. Patient will verbalize two of their positive qualities 2. Patient will demonstrate empathy for others by stating two positive qualities about a peer in the group 3. Patient will verbalize their feelings when voicing positive self affirmations and when voicing positive affirmations of others 4. Patients will discuss the potential positive impact on their wellness/recovery of focusing on positive traits of self and others. Summary of Patient Progress:  Active and engaged when present, but about half way through another patient entered the room and he immediately began talking to her.  I asked them to stop, and they persisted, so I asked them to leave.Marland Kitchen. He did.  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 He was able to do this by reminding himself that "it's just for a little while-the end is in sight."  Therapeutic Modalities Cognitive Behavioral Therapy Motivational Interviewing  Ida RogueRodney B Brodi Nery, LCSW 08/20/2017 2:59 PM

## 2017-08-20 NOTE — Progress Notes (Signed)
[facility] Group Notes:  (Nursing/MHT/Case Management/Adjunct)  Date:  08/20/2017  Time:  9:29 PM  Type of Therapy:  Wrap up group  Participation Level:  Active  Participation Quality:  Appropriate and Sharing  Affect:  Appropriate  Cognitive:  Alert and Appropriate  Insight:  Appropriate and Good  Engagement in Group:  Engaged and Supportive  Modes of Intervention:  Discussion  Summary of Progress/Problems: Keith Dudley shared with the group on today how his day went from a 10 to a 5.  [name] had expectation of being discharged but the plan had changed. He continued to say that he was ready for discharge so he can return to work.  [name] is hoping to be discharged on tomorrow.   [name], [name] 08/20/2017, 9:29 PM

## 2017-08-20 NOTE — Progress Notes (Signed)
Nursing note 7p-7a  Pt observed interacting with peers on unit this shift. Displayed a bright affect and mood upon interaction with this Clinical research associatewriter. Pt denies pain ,denies SI/HI, and also denies any audio or visual hallucinations at this time. Stated that he is upset with his mom because she lied on him to get him to stay here longer. Pt complained of insomnia, see MAR for PRN medication administration.  Pt is able to verbally contract for safety with this RN. Goal: "To leave out the negative things and think positive." Pt is now resting in bed with eyes closed, with no signs or symptoms of pain or distress noted. Pt continues to remain safe on the unit and is observed by rounding every 15 min. RN will continue to monitor.

## 2017-08-20 NOTE — Progress Notes (Signed)
D- Patient alert and oriented. Affect/mood. Patient denies SI, HI, AVH, and pain at this time. Patient also denies any signs/symptoms of depression/anxiety at this time. Patient's goal for today is to "get up out of here".  A- Scheduled medications administered to patient, per MD orders. Support and encouragement provided.  Routine safety checks conducted every 15 minutes.  Patient informed to notify staff with problems or concerns.  R- No adverse drug reactions noted. Patient contracts for safety at this time. Patient compliant with medications and treatment plan. Patient receptive, calm, and cooperative. Patient interacts well with others on the unit.  Patient remains safe at this time.

## 2017-08-21 MED ORDER — MIRTAZAPINE 15 MG PO TABS: 15.0000 mg | ORAL_TABLET | Freq: Every day | ORAL | 1 refills | Status: DC

## 2017-08-21 MED ORDER — TRAZODONE HCL 100 MG PO TABS: 100.0000 mg | ORAL_TABLET | Freq: Every evening | ORAL | 1 refills | Status: DC | PRN

## 2017-08-21 MED ORDER — PANTOPRAZOLE SODIUM 40 MG PO TBEC: 40.0000 mg | DELAYED_RELEASE_TABLET | Freq: Two times a day (BID) | ORAL | 1 refills | Status: DC

## 2017-08-21 NOTE — Progress Notes (Signed)
Nursing note 7p-7a  Pt observed interacting with peers on unit this shift. Displayed a bright  affect and cheerful mood upon interaction with this Clinical research associatewriter. Pt denies pain ,denies SI/HI, and also denies any audio or visual hallucinations at this time. Pt is able to verbally contract for safety with this RN. Goal: " to love myself more" Pt is now resting in bed with eyes closed, with no signs or symptoms of pain or distress noted. Pt continues to remain safe on the unit and is observed by rounding every 15 min. RN will continue to monitor.

## 2017-08-21 NOTE — Progress Notes (Signed)
[facility] MD Progress Note  08/21/2017 1:33 PM Keith Dudley  MRN:  [id number]  Subjective:    Keith Dudley met with treatment team this morning. He denies any symptoms ofe depression, anxiety or psychosis. He is not suicidal or homicidal. Tolerates medications well. Worries about bills. Family is absolutely frantic about discharge.   Spoke with the mother and the father this morning. Family meeting tomorrow at 9:00 prior to discharge.  Principal Problem: Severe major depression, single episode, without psychotic features ([place]) Diagnosis:   Patient Active Problem List   Diagnosis Date Noted  . Severe major depression, single episode, without psychotic features ([place]) [F32.2] 08/16/2017    Priority: High  . Cannabis use disorder, moderate, dependence ([place]) [F12.20] 08/19/2017  . Tobacco use disorder [F17.200] 08/19/2017  . Alcohol use disorder, moderate, dependence ([place]) [F10.20] 08/17/2017  . Adjustment disorder with mixed disturbance of emotions and conduct [F43.25] 08/17/2017  . Gastritis [K29.70] 08/17/2017  . Benzodiazepine overdose [T42.4X1A] 08/16/2017   Total Time spent with patient: 20 minutes  Past Psychiatric History: alcoholism  Past Medical History:  Past Medical History:  Diagnosis Date  . Alcoholic gastritis with bleeding    History reviewed. No pertinent surgical history. Family History: History reviewed. No pertinent family history. Family Psychiatric  History: none Social History:  Social History   Substance and Sexual Activity  Alcohol Use Yes   Comment: 0.5 fifth of liquor 08/16/17     Social History   Substance and Sexual Activity  Drug Use Not Currently    Social History   Socioeconomic History  . Marital status: Single    Spouse name: Not on file  . Number of children: Not on file  . Years of education: Not on file  . Highest education level: Not on file  Occupational History  . Not on file  Social Needs  . Financial resource strain: Not on file  .  Food insecurity:    Worry: Not on file    Inability: Not on file  . Transportation needs:    Medical: Not on file    Non-medical: Not on file  Tobacco Use  . Smoking status: Current Some Day Smoker    Types: Cigarettes  . Smokeless tobacco: Never Used  Substance and Sexual Activity  . Alcohol use: Yes    Comment: 0.5 fifth of liquor 08/16/17  . Drug use: Not Currently  . Sexual activity: Yes  Lifestyle  . Physical activity:    Days per week: Not on file    Minutes per session: Not on file  . Stress: Not on file  Relationships  . Social connections:    Talks on phone: Not on file    Gets together: Not on file    Attends religious service: Not on file    Active member of club or organization: Not on file    Attends meetings of clubs or organizations: Not on file    Relationship status: Not on file  Other Topics Concern  . Not on file  Social History Narrative  . Not on file   Additional Social History:                         Sleep: Fair  Appetite:  Fair  Current Medications: Current Facility-Administered Medications  Medication Dose Route Frequency Provider Last Rate Last Dose  . acetaminophen (TYLENOL) tablet 650 mg  650 mg Oral Q6H PRN Clapacs, Madie Reno, MD      . alum &  mag hydroxide-simeth (MAALOX/MYLANTA) 200-200-20 MG/5ML suspension 30 mL  30 mL Oral Q4H PRN Clapacs, John T, MD      . chlordiazePOXIDE (LIBRIUM) capsule 10 mg  10 mg Oral QID Darthy Manganelli B, MD   10 mg at 08/21/17 1206  . hydrOXYzine (ATARAX/VISTARIL) tablet 50 mg  50 mg Oral Q6H PRN Clapacs, Madie Reno, MD   50 mg at 08/19/17 1224  . magnesium hydroxide (MILK OF MAGNESIA) suspension 30 mL  30 mL Oral Daily PRN Clapacs, John T, MD      . mirtazapine (REMERON) tablet 15 mg  15 mg Oral QHS Adelee Hannula B, MD   15 mg at 08/20/17 2121  . pantoprazole (PROTONIX) EC tablet 40 mg  40 mg Oral BID Clapacs, Madie Reno, MD   40 mg at 08/21/17 0813  . traZODone (DESYREL) tablet 100 mg  100 mg  Oral QHS PRN Clapacs, Madie Reno, MD   100 mg at 08/20/17 2121    Lab Results: No results found for this or any previous visit (from the past 48 hour(s)).  Blood Alcohol level:  Lab Results  Component Value Date   ETH 117 (H) 08/18/2017   ETH 75 (H) 98/92/1194    Metabolic Disorder Labs: Lab Results  Component Value Date   HGBA1C 5.6 08/18/2017   MPG 114.02 08/18/2017   No results found for: PROLACTIN Lab Results  Component Value Date   CHOL 177 08/18/2017   TRIG 73 08/18/2017   HDL 82 08/18/2017   CHOLHDL 2.2 08/18/2017   VLDL 15 08/18/2017   LDLCALC 80 08/18/2017    Physical Findings: AIMS: Facial and Oral Movements Muscles of Facial Expression: None, normal Lips and Perioral Area: None, normal Jaw: None, normal Tongue: None, normal,Extremity Movements Upper (arms, wrists, hands, fingers): None, normal Lower (legs, knees, ankles, toes): None, normal, Trunk Movements Neck, shoulders, hips: None, normal, Overall Severity Severity of abnormal movements (highest score from questions above): None, normal Incapacitation due to abnormal movements: None, normal Patient's awareness of abnormal movements (rate only patient's report): No Awareness, Dental Status Current problems with teeth and/or dentures?: No Does patient usually wear dentures?: No  CIWA:    COWS:     Musculoskeletal: Strength & Muscle Tone: within normal limits Gait & Station: normal Patient leans: N/A  Psychiatric Specialty Exam: Physical Exam  Nursing note and vitals reviewed. Psychiatric: He has a normal mood and affect. His speech is normal and behavior is normal. Thought content normal. Cognition and memory are normal. He expresses impulsivity.    Review of Systems  Neurological: Negative.   Psychiatric/Behavioral: Positive for substance abuse.  All other systems reviewed and are negative.   Blood pressure 121/76, pulse (!) 108, temperature 97.8 F (36.6 C), temperature source Oral, resp. rate  16, height 5' 9.69" (1.77 m), weight 73 kg (161 lb), SpO2 100 %.Body mass index is 23.31 kg/m.  General Appearance: Casual  Eye Contact:  Good  Speech:  Clear and Coherent  Volume:  Normal  Mood:  Euthymic  Affect:  Appropriate  Thought Process:  Goal Directed and Descriptions of Associations: Intact  Orientation:  Full (Time, Place, and Person)  Thought Content:  WDL  Suicidal Thoughts:  No  Homicidal Thoughts:  No  Memory:  Immediate;   Fair Recent;   Fair Remote;   Fair  Judgement:  Poor  Insight:  Lacking  Psychomotor Activity:  Normal  Concentration:  Concentration: Fair and Attention Span: Fair  Recall:  [employer] of Knowledge:  Fair  Language:  Fair  Akathisia:  No  Handed:  Right  AIMS (if indicated):     Assets:  Communication Skills Desire for Improvement Housing Physical Health Resilience Social Support Vocational/Educational  ADL's:  Intact  Cognition:  WNL  Sleep:  Number of Hours: 6.75     Treatment Plan Summary: Daily contact with patient to assess and evaluate symptoms and progress in treatment and Medication management   Keith Dudley is a 22 year old male with no past psychiatric history admitted after overdose on medication in the context of relationship problems.   #Suicidal ideation -patient adamantly denies any thoughts intention or plans to hurt himself or others  #Mood -start Remeron 15 mg nightly -open to psychotherapy  #Substance abuse -completes Librium taper -positive for alcohol and cannabis -denies daily drinking -no symptoms of alcohol withdrawal, VS are stable -declines substance abuse treatment  #Alcoholic gastritis -continue Protonix  #Smoking cassation -nicotine patch was offered  #Disposition -discharge with family -follow up with [facility]    Orson Slick, MD 08/21/2017, 1:33 PM

## 2017-08-21 NOTE — Discharge Summary (Signed)
Physician Discharge Summary Note  Patient:  Keith Dudley is an 22 y.o., male MRN:  [id number] DOB:  [birth year] Patient phone:  [phone] (home)  Patient address:   [address] [place] [address],  Total Time spent with patient: 20 minutes plus 15 min on care coordination and documantation  Date of Admission:  08/18/2017 Date of Discharge: 08/22/2017  Reason for Admission:  Overdose.  History of Present Illness:   Keith Dudley is a 22 year old male with no past psychiatric history.  Chief complaint. "It was stupid."  History of present illness. Information was obtained from the patient and the chart. The patient was brought to the hospital after overdose on OTC medication in the context of relationship brake-up. He was in the ER 3 tmes since 7/10. First time with alcoholic gastritis after a night of drinking. Second, after overdose on Klonopin, that he received from a friend, with intent to die after boyfriend broke up with him.  He then returned following another overdose on OTC medication when he realized that there were unflattering comments on snap chat including information about his sexual orientation. The patient denies any problems with depression, anxiety or psychosis until last Sunday, when his problems started. He drinks alcohol and consumption escalated since Sunday. He has been drinking liquor. He smokes cannabis. He denies benzodiazepine use except for overdose.   Spoke with the mother at length. The patient has been calling family and friends telling them that he wants to die. They believe he is at grave risk of overdose again. She is also concerned with his drinking. She believes that he has been   Past psychiatric history. Denies any. He made appointment at [facility] since problems started/  Family psychiatric history. None.  Social history. Works two jobs. He and his now ex-boyfriend recently rented an apartment together. The patient intends to keep it. He has  supportive family.   Principal Problem: Severe major depression, single episode, without psychotic features [facility]) Discharge Diagnoses: Patient Active Problem List   Diagnosis Date Noted  . Severe major depression, single episode, without psychotic features ([facility]) [F32.2] 08/16/2017    Priority: High  . Cannabis use disorder, moderate, dependence ([facility]) [F12.20] 08/19/2017  . Tobacco use disorder [F17.200] 08/19/2017  . Alcohol use disorder, moderate, dependence ([facility]) [F10.20] 08/17/2017  . Adjustment disorder with mixed disturbance of emotions and conduct [F43.25] 08/17/2017  . Gastritis [K29.70] 08/17/2017  . Benzodiazepine overdose [T42.4X1A] 08/16/2017     Past Medical History:  Past Medical History:  Diagnosis Date  . Alcoholic gastritis with bleeding    History reviewed. No pertinent surgical history. Family History: History reviewed. No pertinent family history.  Social History:  Social History   Substance and Sexual Activity  Alcohol Use Yes   Comment: 0.5 fifth of liquor 08/16/17     Social History   Substance and Sexual Activity  Drug Use Not Currently    Social History   Socioeconomic History  . Marital status: Single    Spouse name: Not on file  . Number of children: Not on file  . Years of education: Not on file  . Highest education level: Not on file  Occupational History  . Not on file  Social Needs  . Financial resource strain: Not on file  . Food insecurity:    Worry: Not on file    Inability: Not on file  . Transportation needs:    Medical: Not on file    Non-medical: Not on file  Tobacco Use  .  Smoking status: Current Some Day Smoker    Types: Cigarettes  . Smokeless tobacco: Never Used  Substance and Sexual Activity  . Alcohol use: Yes    Comment: 0.5 fifth of liquor 08/16/17  . Drug use: Not Currently  . Sexual activity: Yes  Lifestyle  . Physical activity:    Days per week: Not on file    Minutes per session: Not on file  . Stress:  Not on file  Relationships  . Social connections:    Talks on phone: Not on file    Gets together: Not on file    Attends religious service: Not on file    Active member of club or organization: Not on file    Attends meetings of clubs or organizations: Not on file    Relationship status: Not on file  Other Topics Concern  . Not on file  Social History Narrative  . Not on file    Hospital Course:    Keith Dudley is a 22 year old male with no past psychiatric history admitted after overdose on medication in the context of relationship problems. He accepted medications and tolerated them well. At the time of discharge, the patient adamantly denied any thoughts intention or plans to hurt himself or others. He was able to contract for safety. He is forward thinking and optimistic about the future. He has supportive family.  #Mood, improved -continue Remeron 15 mg nightly -continie Trazodone 100 mg nightly PRN  #Substance abuse, positive for alcohol and cannabis -completed Librium taper -declines substance abuse treatment  #Alcoholic gastritis -continue Protonix  #Smoking cassation -nicotine patch was offered  #Disposition -family meeting completed -discharge with family -follow up with [facility]    Physical Findings: AIMS: Facial and Oral Movements Muscles of Facial Expression: None, normal Lips and Perioral Area: None, normal Jaw: None, normal Tongue: None, normal,Extremity Movements Upper (arms, wrists, hands, fingers): None, normal Lower (legs, knees, ankles, toes): None, normal, Trunk Movements Neck, shoulders, hips: None, normal, Overall Severity Severity of abnormal movements (highest score from questions above): None, normal Incapacitation due to abnormal movements: None, normal Patient's awareness of abnormal movements (rate only patient's report): No Awareness, Dental Status Current problems with teeth and/or dentures?: No Does patient usually wear dentures?: No   CIWA:    COWS:     Musculoskeletal: Strength & Muscle Tone: within normal limits Gait & Station: normal Patient leans: N/A  Psychiatric Specialty Exam: Physical Exam  Nursing note and vitals reviewed. Psychiatric: He has a normal mood and affect. His speech is normal and behavior is normal. Thought content normal. Cognition and memory are normal. He expresses impulsivity.    Review of Systems  Neurological: Negative.   Psychiatric/Behavioral: Positive for substance abuse.  All other systems reviewed and are negative.   Blood pressure 138/88, pulse 72, temperature 97.6 F (36.4 C), temperature source Oral, resp. rate 18, height 5' 9.69" (1.77 m), weight 73 kg (161 lb), SpO2 100 %.Body mass index is 23.31 kg/m.  General Appearance: Casual  Eye Contact:  Good  Speech:  Clear and Coherent  Volume:  Normal  Mood:  Euthymic  Affect:  Appropriate  Thought Process:  Goal Directed and Descriptions of Associations: Intact  Orientation:  Full (Time, Place, and Person)  Thought Content:  WDL  Suicidal Thoughts:  No  Homicidal Thoughts:  No  Memory:  Immediate;   Fair Recent;   Fair Remote;   Fair  Judgement:  Poor  Insight:  Lacking  Psychomotor Activity:  Normal  Concentration:  Concentration: Fair and Attention Span: Fair  Recall:  [employer] of Knowledge:  Fair  Language:  Fair  Akathisia:  No  Handed:  Right  AIMS (if indicated):     Assets:  Communication Skills Desire for Improvement Housing Physical Health Resilience Social Support Vocational/Educational  ADL's:  Intact  Cognition:  WNL  Sleep:  Number of Hours: 6.15     Have you used any form of tobacco in the last 30 days? (Cigarettes, Smokeless Tobacco, Cigars, and/or Pipes): No  Has this patient used any form of tobacco in the last 30 days? (Cigarettes, Smokeless Tobacco, Cigars, and/or Pipes) Yes, Yes, A prescription for an FDA-approved tobacco cessation medication was offered at discharge and the patient  refused  Blood Alcohol level:  Lab Results  Component Value Date   ETH 117 (H) 08/18/2017   ETH 75 (H) 08/16/2017    Metabolic Disorder Labs:  Lab Results  Component Value Date   HGBA1C 5.6 08/18/2017   MPG 114.02 08/18/2017   No results found for: PROLACTIN Lab Results  Component Value Date   CHOL 177 08/18/2017   TRIG 73 08/18/2017   HDL 82 08/18/2017   CHOLHDL 2.2 08/18/2017   VLDL 15 08/18/2017   LDLCALC 80 08/18/2017    See Psychiatric Specialty Exam and Suicide Risk Assessment completed by Attending Physician prior to discharge.  Discharge destination:  Home  Is patient on multiple antipsychotic therapies at discharge:  No   Has Patient had three or more failed trials of antipsychotic monotherapy by history:  No  Recommended Plan for Multiple Antipsychotic Therapies: NA  Discharge Instructions    Diet - low sodium heart healthy   Complete by:  As directed    Increase activity slowly   Complete by:  As directed      Allergies as of 08/22/2017   No Known Allergies     Medication List    TAKE these medications     Indication  mirtazapine 15 MG tablet Commonly known as:  REMERON Take 1 tablet (15 mg total) by mouth at bedtime.  Indication:  Major Depressive Disorder   pantoprazole 40 MG tablet Commonly known as:  PROTONIX Take 1 tablet (40 mg total) by mouth 2 (two) times daily.  Indication:  alcoholic gastritis   traZODone 100 MG tablet Commonly known as:  DESYREL Take 1 tablet (100 mg total) by mouth at bedtime as needed for sleep.  Indication:  Trouble Sleeping      Follow-up Information    [employer] Go on 08/23/2017.   Why:  Please follow up with Unk Pinto for peer support services on Wednesday 08/23/2017 at 7am. Lorella Nimrod will come pick you up at 7am. His number is [phone] Contact information: [address] [place] [address] [phone]           Follow-up recommendations:  Activity:  as  tolerated Diet:  regular Other:  keep follow up appointments  Comments:    Signed: Kristine Linea, MD 08/22/2017, 9:37 AM

## 2017-08-21 NOTE — [facility] Group Notes (Signed)
[facility] Group Notes:  (Nursing/MHT/Case Management/Adjunct)  Date:  08/21/2017  Time:  10:17 PM  Type of Therapy:  Group Therapy  Participation Level:  Active  Participation Quality:  Appropriate  Affect:  Appropriate  Cognitive:  Appropriate  Insight:  Appropriate  Engagement in Group:  Engaged  Modes of Intervention:  Support  Summary of Progress/Problems:  Adwoa Axe 08/21/2017, 10:17 PM

## 2017-08-21 NOTE — Plan of Care (Signed)
Pt continues to progress towards goals and d/c. RN will continue to monitor.  

## 2017-08-21 NOTE — Plan of Care (Signed)
Understanding  of information received .   Able to verbalize understanding . Emotional and mental  status improved . Denies suicidal ideations . Understanding of support systems . Voice of no safety concerns  Problem: Safety: Goal: Ability to disclose and discuss suicidal ideas will improve Outcome: Progressing Goal: Ability to identify and utilize support systems that promote safety will improve Outcome: Progressing   Problem: Safety: Goal: Ability to remain free from injury will improve Outcome: Progressing   Problem: Education: Goal: Knowledge of [facility] Education information/materials will improve Outcome: Progressing Goal: Emotional status will improve Outcome: Progressing Goal: Mental status will improve Outcome: Progressing Goal: Verbalization of understanding the information provided will improve Outcome: Progressing

## 2017-08-21 NOTE — Progress Notes (Signed)
Recreation Therapy Notes  Date: 08/21/2017  Time: 9:30 am  Location: Craft Room  Behavioral response: Appropriate    Intervention Topic: Stress  Discussion/Intervention:  Group content on today was focused on stress. The group defined stress and ways to cope with stress. Participants expressed how they know when they are stresses out. Individuals described the different ways they have to cope with stress. The group stated reasons why it is important to cope with stress. Patient explained what good stress is and some examples. The group participated in the intervention "Stress Management Jeopardy". Individuals were separated into two group and answered questions related to stress.   Clinical Observations/Feedback:  Patient came to group late due to unknown reasons.He participated in the intervention and was social with peers and staff in group. Anothony Bursch LRT/CTRS         Yamina Lenis 08/21/2017 12:58 PM

## 2017-08-21 NOTE — Progress Notes (Signed)
D: Patient stated slept good last night .Stated appetite is good and energy level  Is normal. Stated concentration is good . Stated on Depression scale o , hopeless 0 and anxiety 0 .( low 0-10 high) Denies suicidal  homicidal ideations  .  No auditory hallucinations  No pain concerns . Appropriate ADL'S. Interacting with peers and staff.  Patient aware of possible discharge tomorrow  With plans of possible family session. On phone  when phones were available    A: Encourage patient participation with unit programming . Instruction  Given on  Medication , verbalize understanding. R: Voice no other concerns. Staff continue to monitor

## 2017-08-21 NOTE — Progress Notes (Signed)
Recreation Therapy Notes  INPATIENT RECREATION THERAPY ASSESSMENT  Patient Details Name: Sol Passerevin Zappulla MRN: [id number] DOB: 11-06-1995 Today's Date: 08/21/2017       Information Obtained From: Patient  Able to Participate in Assessment/Interview: Yes  Patient Presentation: Responsive  Reason for Admission (Per Patient): Suicidal Ideation  Patient Stressors: Relationship  Coping Skills:   Music, Other (Comment)(Drink)  Leisure Interests (2+):  Social - Friends, Nature - Hiking([place], [place]  Frequency of Recreation/Participation: Monthly  Awareness of Community Resources:  Yes  Community Resources:     Current Use: No  If no, Barriers?: Surveyor, quantityinancial  Expressed Interest in [employer] Resource Information:    [place]  Patient Main Form of Transportation: Other (Comment)(Manager takes me )  Patient Strengths:  My upbringing  Patient Identified Areas of Improvement:  Stop caring what others think  Patient Goal for Hospitalization:  To love myself more  Current SI (including self-harm):  No  Current HI:  No  Current AVH: No  Staff Intervention Plan: Group Attendance, Collaborate with Interdisciplinary Treatment Team  Consent to Intern Participation: N/A  Revel Stellmach 08/21/2017, 3:07 PM

## 2017-08-21 NOTE — Tx Team (Addendum)
Interdisciplinary Treatment and Diagnostic Plan Update  08/21/2017 Time of Session: 10:30am Keith Dudley MRN: [id number]  Principal Diagnosis: Severe major depression, single episode, without psychotic features (HCC)  Secondary Diagnoses: Principal Problem:   Severe major depression, single episode, without psychotic features (HCC) Active Problems:   Benzodiazepine overdose   Alcohol use disorder, moderate, dependence (HCC)   Gastritis   Cannabis use disorder, moderate, dependence (HCC)   Tobacco use disorder   Current Medications:  Current Facility-Administered Medications  Medication Dose Route Frequency Provider Last Rate Last Dose  . acetaminophen (TYLENOL) tablet 650 mg  650 mg Oral Q6H PRN Clapacs, John T, MD      . alum & mag hydroxide-simeth (MAALOX/MYLANTA) 200-200-20 MG/5ML suspension 30 mL  30 mL Oral Q4H PRN Clapacs, John T, MD      . chlordiazePOXIDE (LIBRIUM) capsule 10 mg  10 mg Oral QID Pucilowska, Jolanta B, MD   10 mg at 08/21/17 1206  . hydrOXYzine (ATARAX/VISTARIL) tablet 50 mg  50 mg Oral Q6H PRN Clapacs, Jackquline Denmark, MD   50 mg at 08/19/17 1224  . magnesium hydroxide (MILK OF MAGNESIA) suspension 30 mL  30 mL Oral Daily PRN Clapacs, John T, MD      . mirtazapine (REMERON) tablet 15 mg  15 mg Oral QHS Pucilowska, Jolanta B, MD   15 mg at 08/20/17 2121  . pantoprazole (PROTONIX) EC tablet 40 mg  40 mg Oral BID Clapacs, Jackquline Denmark, MD   40 mg at 08/21/17 0813  . traZODone (DESYREL) tablet 100 mg  100 mg Oral QHS PRN Clapacs, Jackquline Denmark, MD   100 mg at 08/20/17 2121   PTA Medications: No medications prior to admission.    Patient Stressors: Loss of relationship Marital or family conflict Occupational concerns Substance abuse  Patient Strengths: Ability for insight Active sense of humor Capable of independent living Communication skills General fund of knowledge  Treatment Modalities: Medication Management, Group therapy, Case management,  1 to 1 session with  clinician, Psychoeducation, Recreational therapy.   Physician Treatment Plan for Primary Diagnosis: Severe major depression, single episode, without psychotic features (HCC) Long Term Goal(s): Improvement in symptoms so as ready for discharge Improvement in symptoms so as ready for discharge   Short Term Goals: Ability to identify changes in lifestyle to reduce recurrence of condition will improve Ability to verbalize feelings will improve Ability to disclose and discuss suicidal ideas Ability to demonstrate self-control will improve Ability to identify and develop effective coping behaviors will improve Ability to maintain clinical measurements within normal limits will improve Ability to identify triggers associated with substance abuse/mental health issues will improve Ability to identify changes in lifestyle to reduce recurrence of condition will improve Ability to demonstrate self-control will improve Ability to identify triggers associated with substance abuse/mental health issues will improve  Medication Management: Evaluate patient's response, side effects, and tolerance of medication regimen.  Therapeutic Interventions: 1 to 1 sessions, Unit Group sessions and Medication administration.  Evaluation of Outcomes: Progressing  Physician Treatment Plan for Secondary Diagnosis: Principal Problem:   Severe major depression, single episode, without psychotic features (HCC) Active Problems:   Benzodiazepine overdose   Alcohol use disorder, moderate, dependence (HCC)   Gastritis   Cannabis use disorder, moderate, dependence (HCC)   Tobacco use disorder  Long Term Goal(s): Improvement in symptoms so as ready for discharge Improvement in symptoms so as ready for discharge   Short Term Goals: Ability to identify changes in lifestyle to reduce recurrence of condition will  improve Ability to verbalize feelings will improve Ability to disclose and discuss suicidal ideas Ability to  demonstrate self-control will improve Ability to identify and develop effective coping behaviors will improve Ability to maintain clinical measurements within normal limits will improve Ability to identify triggers associated with substance abuse/mental health issues will improve Ability to identify changes in lifestyle to reduce recurrence of condition will improve Ability to demonstrate self-control will improve Ability to identify triggers associated with substance abuse/mental health issues will improve     Medication Management: Evaluate patient's response, side effects, and tolerance of medication regimen.  Therapeutic Interventions: 1 to 1 sessions, Unit Group sessions and Medication administration.  Evaluation of Outcomes: Progressing   RN Treatment Plan for Primary Diagnosis: Severe major depression, single episode, without psychotic features (HCC) Long Term Goal(s): Knowledge of disease and therapeutic regimen to maintain health will improve  Short Term Goals: Ability to identify and develop effective coping behaviors will improve and Compliance with prescribed medications will improve  Medication Management: RN will administer medications as ordered by provider, will assess and evaluate patient's response and provide education to patient for prescribed medication. RN will report any adverse and/or side effects to prescribing provider.  Therapeutic Interventions: 1 on 1 counseling sessions, Psychoeducation, Medication administration, Evaluate responses to treatment, Monitor vital signs and CBGs as ordered, Perform/monitor CIWA, COWS, AIMS and Fall Risk screenings as ordered, Perform wound care treatments as ordered.  Evaluation of Outcomes: Progressing   LCSW Treatment Plan for Primary Diagnosis: Severe major depression, single episode, without psychotic features (HCC) Long Term Goal(s): Safe transition to appropriate next level of care at discharge, Engage patient in  therapeutic group addressing interpersonal concerns.  Short Term Goals: Engage patient in aftercare planning with referrals and resources, Identify triggers associated with mental health/substance abuse issues and Increase skills for wellness and recovery  Therapeutic Interventions: Assess for all discharge needs, 1 to 1 time with Social worker, Explore available resources and support systems, Assess for adequacy in community support network, Educate family and significant other(s) on suicide prevention, Complete Psychosocial Assessment, Interpersonal group therapy.  Evaluation of Outcomes: Progressing   Progress in Treatment: Attending groups: Yes. Participating in groups: Yes. Taking medication as prescribed: Yes. Toleration medication: Yes. Family/Significant other contact made: Yes, individual(s) contacted:    Patient understands diagnosis: Yes. Discussing patient identified problems/goals with staff: Yes. Medical problems stabilized or resolved: Yes. Denies suicidal/homicidal ideation: Yes. Issues/concerns per patient self-inventory: No. 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  Reason for Continuation of Hospitalization: Anxiety Delusions  Medication stabilization  Coordination of aftercare  Estimated Length of Stay: 2-3   Recreational Therapy: Patient Stressors: Relationship  Patient Goal: Patient will identify benefit of making healthy decisions post d/c within 5 recreation therapy group sessions  Attendees: Patient:Keith Dudley 08/21/2017 4:40 PM  Physician: Kristine LineaJolanta Pucilowska, MD 08/21/2017 4:40 PM  Nursing: Hulan AmatoGwen Farrish, RN 08/21/2017 4:40 PM  RN Care Manager: 08/21/2017 4:40 PM  Social Worker: Jake SharkSara Laws, LCSW  08/21/2017 4:40 PM  Recreational Therapist: Garret ReddishShay Will Heinkel, LRT 08/21/2017 4:40 PM  Other: Heidi DachKelsey Craig, LCSW 08/21/2017 4:40 PM  Other:  08/21/2017 4:40 PM  Other: 08/21/2017 4:40 PM    Scribe for Treatment Team: Glennon MacSara P Laws, LCSW 08/21/2017  4:40 PM

## 2017-08-21 NOTE — B[facility] Suicide Risk Assessment (Signed)
[facility]   Principal Problem: Severe major depression, single episode, without psychotic features [facility](HCC) Discharge Diagnoses:  Patient Active Problem List   Diagnosis Date Noted  . Severe major depression, single episode, without psychotic features (HCC) [F32.2] 08/16/2017    Priority: High  . Cannabis use disorder, moderate, dependence (HCC) [F12.20] 08/19/2017  . Tobacco use disorder [F17.200] 08/19/2017  . Alcohol use disorder, moderate, dependence (HCC) [F10.20] 08/17/2017  . Adjustment disorder with mixed disturbance of emotions and conduct [F43.25] 08/17/2017  . Gastritis [K29.70] 08/17/2017  . Benzodiazepine overdose [T42.4X1A] 08/16/2017    Total Time spent with patient: 20 minutes  Musculoskeletal: Strength & Muscle Tone: within normal limits Gait & Station: normal Patient leans: N/A  Psychiatric Specialty Exam: Review of Systems  Neurological: Negative.   Psychiatric/Behavioral: Positive for substance abuse.  All other systems reviewed and are negative.   Blood pressure 138/88, pulse 72, temperature 97.6 F (36.4 C), temperature source Oral, resp. rate 18, height 5' 9.69" (1.77 m), weight 73 kg (161 lb), SpO2 100 %.Body mass index is 23.31 kg/m.  General Appearance: Casual  Eye Contact::  Good  Speech:  Clear and Coherent409  Volume:  Normal  Mood:  Euthymic  Affect:  Appropriate  Thought Process:  Goal Directed and Descriptions of Associations: Intact  Orientation:  Full (Time, Place, and Person)  Thought Content:  WDL  Suicidal Thoughts:  No  Homicidal Thoughts:  No  Memory:  Immediate;   Fair Recent;   Fair Remote;   Fair  Judgement:  Poor  Insight:  Lacking  Psychomotor Activity:  Normal  Concentration:  Fair  Recall:  Fis[employer]Fair  Fund of Knowledge:Fair  Language: Fair  Akathisia:  No  Handed:  Right  AIMS (if indicated):     Assets:  Communication Skills Desire for Improvement Housing Physical Health Resilience Social  Support Vocational/Educational  Sleep:  Number of Hours: 6.15  Cognition: WNL  ADL's:  Intact   Mental Status Per Nursing Assessment::   On Admission:  NA  Demographic Factors:  Male, Adolescent or young adult, Gay, lesbian, or bisexual orientation and Living alone  Loss Factors: Loss of significant relationship  Historical Factors: Impulsivity  Risk Reduction Factors:   Sense of responsibility to family, Employed and Positive social support  Continued Clinical Symptoms:  Depression:   Comorbid alcohol abuse/dependence Impulsivity Alcohol/Substance Abuse/Dependencies  Cognitive Features That Contribute To Risk:  None    Suicide Risk:  Minimal: No identifiable suicidal ideation.  Patients presenting with no risk factors but with morbid ruminations; may be classified as minimal risk based on the severity of the depressive symptoms  Follow-up Information    [facility] Go on 08/23/2017.   Why:  Please follow up with Keith Dudley for peer support services on Wednesday 08/23/2017 at 7am. Lorella NimrodHarvey will come pick you up at 7am. His number is [phone] Contact information: [address] [place] [address] [phone]           Plan Of Care/Follow-up recommendations:  Activity:  as tolerated Diet:  regular Other:  keep follow up appointments  Kristine LineaJolanta Lawren Sexson, MD 08/22/2017, 9:37 AM

## 2017-08-22 NOTE — Progress Notes (Signed)
Recreation Therapy Notes  INPATIENT RECREATION TR PLAN  Patient Details Name: Keith Dudley MRN: [id number] DOB: April 15, 1995 Today's Date: 08/22/2017  Rec Therapy Plan Is patient appropriate for Therapeutic Recreation?: Yes Treatment times per week: at least 3 Estimated Length of Stay: 5-7 days TR Treatment/Interventions: Group participation (Comment)  Discharge Criteria Pt will be discharged from therapy if:: Discharged Treatment plan/goals/alternatives discussed and agreed upon by:: Patient/family  Discharge Summary Short term goals set: Patient will identify benefit of making healthy decisions post d/c within 5 recreation therapy group sessions Short term goals met: Adequate for discharge Progress toward goals comments: Groups attended Which groups?: Stress management Reason goals not met: N/A Therapeutic equipment acquired: N/A Reason patient discharged from therapy: Discharge from hospital Pt/family agrees with progress & goals achieved: Yes Date patient discharged from therapy: 08/22/17   Tonika Eden 08/22/2017, 1:18 PM

## 2017-08-22 NOTE — Progress Notes (Signed)
  [facility] :  Will you be returning to the same living situation after discharge:  Yes,  Back to his apartment At discharge, do you have transportation home?: Yes,  Family is coming at discharge Do you have the ability to pay for your medications: Yes,  Referred to a provider who can assist  Release of information consent forms completed and in the chart;  Patient's signature needed at discharge.  Patient to Follow up at: Follow-up Information    [facility] Go on 08/23/2017.   Why:  Please follow up with Unk PintoHarvey Bryant for peer support services on Wednesday 08/23/2017 at 7am. Lorella NimrodHarvey will come pick you up at 7am. His number is [phone] Contact information: [address] [place] [id number] [phone]           Next level of care provider [facility] Link:no  Safety Planning and Suicide Prevention discussed: Yes,  Completed with patient  Have you used any form of tobacco in the last 30 days? (Cigarettes, Smokeless Tobacco, Cigars, and/or Pipes): No  Has patient been referred to the Quitline?: N/A patient is not a smoker  Patient has been referred for addiction treatment: Yes  Johny ShearsCassandra  Thandiwe Siragusa, LCSW 08/22/2017, 9:22 AM

## 2017-08-22 NOTE — BHH Group Notes (Signed)
CSW Group Therapy Note  08/22/2017  Time:  0900  Type of Therapy and Topic: Group Therapy: Goals Group: SMART Goals    Participation Level:  Did Not Attend    Description of Group:   The purpose of a daily goals group is to assist and guide patients in setting recovery/wellness-related goals. The objective is to set goals as they relate to the crisis in which they were admitted. Patients will be using SMART goal modalities to set measurable goals. Characteristics of realistic goals will be discussed and patients will be assisted in setting and processing how one will reach their goal. Facilitator will also assist patients in applying interventions and coping skills learned in psycho-education groups to the SMART goal and process how one will achieve defined goal.    Therapeutic Goals:  -Patients will develop and document one goal related to or their crisis in which brought them into treatment.  -Patients will be guided by LCSW using SMART goal setting modality in how to set a measurable, attainable, realistic and time sensitive goal.  -Patients will process barriers in reaching goal.  -Patients will process interventions in how to overcome and successful in reaching goal.    Patient's Goal:  Pt was involved in a family  Meeting during group time.    Therapeutic Modalities:  Motivational Interviewing  Cognitive Behavioral Therapy  Crisis Intervention Model  SMART goals setting  Heidi DachKelsey Demoni Gergen, MSW, LCSW Clinical Social Worker 08/22/2017 9:30 AM

## 2017-08-22 NOTE — Progress Notes (Signed)
D: Patient is aware of  Discharge this shift .Patient denies suicidal /homicidal ideations. Patient received all belongings brought in   A: No Storage medications. Writer reviewed Discharge Summary, Suicide Risk Assessment, and Transitional Record. Patient also received Prescriptions   from  MD. A 7 day supply of medications given to patient . Aware  Of follow up appointment .  R: Patient left unit with no questions  Or concerns  With family 

## 2017-08-22 NOTE — Plan of Care (Signed)
Patient contract for safety and denies any SI/HI/AVH. Patient is anticipating to be discharged today, expressed no concerns, monitored every 15 for safety no distress noted. Problem: Education: Goal: Knowledge of [facility] Education information/materials will improve Outcome: Progressing Goal: Emotional status will improve Outcome: Progressing Goal: Mental status will improve Outcome: Progressing Goal: Verbalization of understanding the information provided will improve Outcome: Progressing   Problem: Safety: Goal: Ability to disclose and discuss suicidal ideas will improve Outcome: Progressing Goal: Ability to identify and utilize support systems that promote safety will improve Outcome: Progressing   Problem: Safety: Goal: Ability to remain free from injury will improve Outcome: Progressing

## 2017-08-22 NOTE — Progress Notes (Signed)
Recreation Therapy Notes  Date: 08/22/2017  Time: 9:30 am   Location: Craft Room   Behavioral response: N/A   Intervention Topic:  Relaxation  Discussion/Intervention: Patient did not attend group.   Clinical Observations/Feedback:  Patient did not attend group.   Wenonah Milo LRT/CTRS        Rhapsody Wolven 08/22/2017 10:43 AM

## 2017-10-14 ENCOUNTER — Encounter: Payer: Self-pay | Admitting: Emergency Medicine

## 2017-10-14 ENCOUNTER — Emergency Department
Admission: EM | Admit: 2017-10-14 | Discharge: 2017-10-14 | Disposition: A | Discharge status: Home / Self Care | Payer: No Typology Code available for payment source | Attending: Emergency Medicine | Admitting: Emergency Medicine

## 2017-10-14 ENCOUNTER — Other Ambulatory Visit: Payer: Self-pay

## 2017-10-14 ENCOUNTER — Emergency Department: Payer: No Typology Code available for payment source

## 2017-10-14 DIAGNOSIS — E876 Hypokalemia: Secondary | ICD-10-CM

## 2017-10-14 DIAGNOSIS — Y908 Blood alcohol level of 240 mg/100 ml or more: Secondary | ICD-10-CM | POA: Diagnosis not present

## 2017-10-14 DIAGNOSIS — M25511 Pain in right shoulder: Secondary | ICD-10-CM | POA: Diagnosis present

## 2017-10-14 DIAGNOSIS — Z79899 Other long term (current) drug therapy: Secondary | ICD-10-CM | POA: Diagnosis not present

## 2017-10-14 DIAGNOSIS — F17210 Nicotine dependence, cigarettes, uncomplicated: Secondary | ICD-10-CM | POA: Diagnosis not present

## 2017-10-14 DIAGNOSIS — F10120 Alcohol abuse with intoxication, uncomplicated: Secondary | ICD-10-CM | POA: Diagnosis not present

## 2017-10-14 DIAGNOSIS — R51 Headache: Secondary | ICD-10-CM | POA: Insufficient documentation

## 2017-10-14 DIAGNOSIS — Y929 Unspecified place or not applicable: Secondary | ICD-10-CM | POA: Diagnosis not present

## 2017-10-14 DIAGNOSIS — Y939 Activity, unspecified: Secondary | ICD-10-CM | POA: Insufficient documentation

## 2017-10-14 DIAGNOSIS — Y999 Unspecified external cause status: Secondary | ICD-10-CM | POA: Insufficient documentation

## 2017-10-14 DIAGNOSIS — F10920 Alcohol use, unspecified with intoxication, uncomplicated: Secondary | ICD-10-CM

## 2017-10-14 LAB — CBC WITH DIFFERENTIAL/PLATELET
BASOS PCT: 1 %
Basophils Absolute: 0.1 10*3/uL (ref 0–0.1)
EOS ABS: 0 10*3/uL (ref 0–0.7)
EOS PCT: 0 %
HCT: 48.6 % (ref 40.0–52.0)
Hemoglobin: 16.9 g/dL (ref 13.0–18.0)
LYMPHS ABS: 1.4 10*3/uL (ref 1.0–3.6)
Lymphocytes Relative: 17 %
MCH: 32.5 pg (ref 26.0–34.0)
MCHC: 34.7 g/dL (ref 32.0–36.0)
MCV: 93.5 fL (ref 80.0–100.0)
MONOS PCT: 9 %
Monocytes Absolute: 0.8 10*3/uL (ref 0.2–1.0)
NEUTROS PCT: 73 %
Neutro Abs: 6.3 10*3/uL (ref 1.4–6.5)
PLATELETS: 221 10*3/uL (ref 150–440)
RBC: 5.19 MIL/uL (ref 4.40–5.90)
RDW: 13.7 % (ref 11.5–14.5)
WBC: 8.6 10*3/uL (ref 3.8–10.6)

## 2017-10-14 LAB — COMPREHENSIVE METABOLIC PANEL
ALBUMIN: 4.6 g/dL (ref 3.5–5.0)
ALT: 15 U/L (ref 0–44)
ANION GAP: 10 (ref 5–15)
AST: 21 U/L (ref 15–41)
Alkaline Phosphatase: 52 U/L (ref 38–126)
BUN: 11 mg/dL (ref 6–20)
CHLORIDE: 107 mmol/L (ref 98–111)
CO2: 22 mmol/L (ref 22–32)
Calcium: 9.1 mg/dL (ref 8.9–10.3)
Creatinine, Ser: 1.17 mg/dL (ref 0.61–1.24)
GFR calc non Af Amer: >60 mL/min (ref >60)
GLUCOSE: 109 mg/dL — AB (ref 70–99)
POTASSIUM: 2.9 mmol/L — AB (ref 3.5–5.1)
SODIUM: 139 mmol/L (ref 135–145)
Total Bilirubin: 0.9 mg/dL (ref 0.3–1.2)
Total Protein: 7.7 g/dL (ref 6.5–8.1)

## 2017-10-14 LAB — URINE DRUG SCREEN, QUALITATIVE (ARMC ONLY)
AMPHETAMINES, UR SCREEN: NOT DETECTED
BARBITURATES, UR SCREEN: NOT DETECTED
Benzodiazepine, Ur Scrn: NOT DETECTED
COCAINE METABOLITE, UR ~~LOC~~: NOT DETECTED
Cannabinoid 50 Ng, Ur ~~LOC~~: NOT DETECTED
MDMA (ECSTASY) UR SCREEN: NOT DETECTED
Methadone Scn, Ur: NOT DETECTED
Opiate, Ur Screen: NOT DETECTED
Phencyclidine (PCP) Ur S: NOT DETECTED
TRICYCLIC, UR SCREEN: NOT DETECTED

## 2017-10-14 LAB — ETHANOL: Alcohol, Ethyl (B): 238 mg/dL — ABNORMAL HIGH (ref <10)

## 2017-10-14 MED ORDER — MORPHINE SULFATE (PF) 4 MG/ML IV SOLN
4.0000 mg | Freq: Once | INTRAVENOUS | Status: AC
  Administered 2017-10-14: 4 mg via INTRAVENOUS
  Filled 2017-10-14: qty 1

## 2017-10-14 MED ORDER — HYDROCODONE-ACETAMINOPHEN 5-325 MG PO TABS: 1.0000 | ORAL_TABLET | Freq: Four times a day (QID) | ORAL | 0 refills | Status: DC | PRN

## 2017-10-14 MED ORDER — POTASSIUM CHLORIDE CRYS ER 20 MEQ PO TBCR
40.0000 meq | EXTENDED_RELEASE_TABLET | Freq: Once | ORAL | Status: AC
  Administered 2017-10-14: 40 meq via ORAL
  Filled 2017-10-14: qty 2

## 2017-10-14 MED ORDER — SODIUM CHLORIDE 0.9 % IV BOLUS
1000.0000 mL | Freq: Once | INTRAVENOUS | Status: AC
  Administered 2017-10-14: 1000 mL via INTRAVENOUS

## 2017-10-14 MED ORDER — ONDANSETRON HCL 4 MG/2ML IJ SOLN
4.0000 mg | Freq: Once | INTRAMUSCULAR | Status: AC
  Administered 2017-10-14: 4 mg via INTRAVENOUS
  Filled 2017-10-14: qty 2

## 2017-10-14 MED ORDER — IBUPROFEN 600 MG PO TABS: 600.0000 mg | ORAL_TABLET | Freq: Three times a day (TID) | ORAL | 0 refills | Status: DC | PRN

## 2017-10-14 NOTE — ED Notes (Signed)
At time of D/C pt awake, and alert and oriented. Pt states understanding of D/C instructions at this time. Pt ambulatory with steady gate to the lobby. Pt D/C into the care of his friend/manager. Sling placed by this RN. Pt denies any further questions/needs at this time.

## 2017-10-14 NOTE — ED Notes (Signed)
Pt continues to rest in bed with eyes closed, has covered himself up with a blanket at this time. Will continue to monitor.

## 2017-10-14 NOTE — ED Notes (Signed)
Pt continues to rest in bed with eyes closed, respirations even and unlabored. Pt able to self-reposition at this time. Will continue to monitor and administer meds and apply sling when patient is awake.

## 2017-10-14 NOTE — ED Notes (Signed)
Pt requesting that we call Calvin from [facility] to talk to him. Asked why he wanted Calvin, pt stated that he knew he had been there and he will understand. Pt is rolling around on the bed and blowing heavily and hyperventilating. No eye contact made by pt. Asked pt if he was suicidal and pt denied. Pt keeps c/o right arm pain but says his left arm hurts and then corrects himself to the right arm. Pt c/o cold and a blanket was laid over him and pt flung the blanket off the right shoulder because he stated it hurt. Asked pt if the weight of the blanket made his shoulder hurt and pt stated no only when we touch it. Staff have not touched the right shoulder. Pt continues to be beligerent. Meds given and pt to CT.

## 2017-10-14 NOTE — ED Notes (Signed)
This RN spoke with Dr. Dolores Frame regarding patient care, per Dr. Dolores Frame, okay to hold sling and potassium until patient is awake. Pt currently resting in bed at this time with eyes closed, gentle snoring audible at this time. Will continue to monitor until patient is awake.

## 2017-10-14 NOTE — Discharge Instructions (Signed)
1.  You may take pain medicines as needed (Motrin/Norco #15). 2.  Apply ice to affected area several times daily. 3.  Wear sling as needed for comfort. 4.  Return to the ER for worsening symptoms, persistent vomiting, lethargy, difficulty breathing or other concerns.

## 2017-10-14 NOTE — ED Provider Notes (Signed)
Patient's heart rate was up to the 130s while sleeping, no significant hypertension, I do not think that he is a chronic alcohol drinker or showing signs of withdrawal.  Did start a bag of IV fluids.  Patient's ride arrived and patient is awake alert and I was able to talk to him.  No suicidal ideation.  Heart rate 100-1 08.  I think he is okay for discharge now.  I printed Dr. Ardine Bjork prepared discharge instructions.   Governor Rooks, MD 10/14/17 1011

## 2017-10-14 NOTE — ED Notes (Signed)
Pt awakens easily, allowed this RN to place BP cuff and pulse ox, then patient back to sleep. Pt noted to be tachycardic, MD made aware.

## 2017-10-14 NOTE — ED Notes (Signed)
Patient is resting comfortably. 
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
## 2019-09-12 ENCOUNTER — Ambulatory Visit
Admission: EM | Admit: 2019-09-12 | Discharge: 2019-09-12 | Disposition: A | Discharge status: Home / Self Care | Payer: Self-pay | Attending: Emergency Medicine | Admitting: Emergency Medicine

## 2019-09-12 ENCOUNTER — Other Ambulatory Visit: Payer: Self-pay

## 2019-09-12 DIAGNOSIS — H1132 Conjunctival hemorrhage, left eye: Secondary | ICD-10-CM

## 2019-09-12 DIAGNOSIS — S060X0A Concussion without loss of consciousness, initial encounter: Secondary | ICD-10-CM

## 2019-09-12 DIAGNOSIS — R42 Dizziness and giddiness: Secondary | ICD-10-CM

## 2019-09-12 DIAGNOSIS — H209 Unspecified iridocyclitis: Secondary | ICD-10-CM

## 2019-09-12 MED ORDER — MECLIZINE HCL 25 MG PO TABS: 25.0000 mg | ORAL_TABLET | Freq: Three times a day (TID) | ORAL | 0 refills | Status: DC | PRN

## 2019-09-12 MED ORDER — PREDNISOLONE ACETATE 1 % OP SUSP: 1.0000 [drp] | Freq: Four times a day (QID) | OPHTHALMIC | 0 refills | Status: DC

## 2019-09-12 NOTE — Discharge Instructions (Addendum)
I have talked to Dr. Druscilla Brownie, the eye doctor.  He wants to start you on some steroid eyedrops today.  1 drop 4 times a day in your left eye.  He wants to see you tomorrow for comprehensive eye exam.  Make sure you go to [facility] in [place] tomorrow to follow-up with him.  Do not miss this appointment.  Cool compresses, as a milligrams of Tylenol 3-4 times a day as needed for pain.  Wear sunglasses as needed for lights bothering your eyes  Epley maneuver on the right side, this will help fix the vertigo.  There are many good YouTube videos on how to do it if the instructions are not clear.  Meclizine will also help.  Follow up with Dr. Katrinka Blazing as needed.  He has an expert in concussions.  Here is a list of primary care providers who are taking new patients:  Dr. Elizabeth Sauer [address] [place] [address] [phone]  [facility] at [facility] [address] [place], [place] [address] [phone]  [facility] Primary [facility] [place] [address] [place] [address]  [phone]  [facility] [address] [place], [place] [address] [phone]  [facility] [address] [place]  [phone] [place], [place] [address]  Here are clinics/ other resources who will see you if you do not have insurance. Some have certain criteria that you must meet. Call them and find out what they are:  [facility]: [address], [place], [place] [address] Phone: [phone] Hours: First and Third Saturdays of each Month, 9 a.m. - 1 p.m.  [facility]: [address], [address] [place], [place] [address] Phone: [phone] Hours: Tuesday, 4 p.m. - 8 p.m. Thursday, 1 p.m. - 8 p.m. Wednesday, 9 a.m. - [facility] [address], [place], [place] [address] Phone: [phone] Pharmacy Phone Number: [phone] Dental Phone Number: [phone] [facility] Insurance Help: [phone]  Dental Hours: Monday - Thursday, 8 a.m. - 6  p.m.  [facility] [address], [place], [place] [address] Phone: [phone] Pharmacy Phone Number: [phone] [facility] Insurance Help: [phone]  [facility] [address] [place] [place], [place] [address] Phone: [phone] Pharmacy Phone Number: [phone] [facility] Insurance Help: [phone]  [facility] [address] [place], [place] [address] Phone: [phone] [facility] Insurance Help: [phone]   [facility] [address], [place], [place] [address] Phone: [phone]  Go to www.goodrx.com to look up your medications. This will give you a list of where you can find your prescriptions at the most affordable prices. Or ask the pharmacist what the cash price is, or if they have any other discount programs available to help make your medication more affordable. This can be less expensive than what you would pay with insurance.
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
## 2020-06-18 ENCOUNTER — Ambulatory Visit
Admission: EM | Admit: 2020-06-18 | Discharge: 2020-06-18 | Disposition: A | Discharge status: Home / Self Care | Payer: Self-pay | Attending: Emergency Medicine | Admitting: Emergency Medicine

## 2020-06-18 ENCOUNTER — Other Ambulatory Visit: Payer: Self-pay

## 2020-06-18 DIAGNOSIS — K2970 Gastritis, unspecified, without bleeding: Secondary | ICD-10-CM | POA: Insufficient documentation

## 2020-06-18 DIAGNOSIS — K2990 Gastroduodenitis, unspecified, without bleeding: Secondary | ICD-10-CM | POA: Insufficient documentation

## 2020-06-18 LAB — OCCULT BLOOD X 1 CARD TO LAB, STOOL: Fecal Occult Bld: NEGATIVE

## 2020-06-18 MED ORDER — ONDANSETRON 8 MG PO TBDP
8.0000 mg | ORAL_TABLET | Freq: Once | ORAL | Status: AC
  Administered 2020-06-18: 8 mg via ORAL

## 2020-06-18 MED ORDER — LIDOCAINE VISCOUS HCL 2 % MT SOLN
15.0000 mL | Freq: Once | OROMUCOSAL | Status: AC
  Administered 2020-06-18: 15 mL via ORAL

## 2020-06-18 MED ORDER — ALUM & MAG HYDROXIDE-SIMETH 200-200-20 MG/5ML PO SUSP
30.0000 mL | Freq: Once | ORAL | Status: AC
  Administered 2020-06-18: 30 mL via ORAL

## 2020-06-18 MED ORDER — ONDANSETRON 8 MG PO TBDP: 8.0000 mg | ORAL_TABLET | Freq: Three times a day (TID) | ORAL | 0 refills | Status: DC | PRN

## 2020-06-18 MED ORDER — OMEPRAZOLE 20 MG PO CPDR: 20.0000 mg | DELAYED_RELEASE_CAPSULE | Freq: Every day | ORAL | 1 refills | Status: DC

## 2020-06-18 NOTE — ED Provider Notes (Signed)
[facility]    CSN: [id number] Arrival date & time: 06/18/20  0827      History   Chief Complaint Chief Complaint  Patient presents with  . Emesis    HPI Keith Dudley is a 25 y.o. male.   HPI   For evaluation of emesis.  Patient reports that he is had continuous emesis for the past 5 days.  He states that his emesis is sometimes a mixture of food that he is try to eat and other times it yellow and coffee-ground in nature.  He also reports that he felt lightheaded yesterday and had a syncopal event.  He woke up on the floor the bathroom where he was vomiting.  Patient's had a continuous headache and is also complaining of black stools and epigastric abdominal pain.  Patient has not had a fever.    Past Medical History:  Diagnosis Date  . Alcoholic gastritis with bleeding     Patient Active Problem List   Diagnosis Date Noted  . Cannabis use disorder, moderate, dependence (HCC) 08/19/2017  . Tobacco use disorder 08/19/2017  . Alcohol use disorder, moderate, dependence (HCC) 08/17/2017  . 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 Gastritis 08/17/2017  . Severe major depression, single episode, without psychotic features (HCC) 08/16/2017  . Benzodiazepine overdose 08/16/2017    History reviewed. No pertinent surgical history.     Home Medications    Prior to Admission medications   Medication Sig Start Date End Date Taking? Authorizing Provider  omeprazole (PRILOSEC) 20 MG capsule Take 1 capsule (20 mg total) by mouth daily. 06/18/20  Yes Becky Augusta, NP  ondansetron (ZOFRAN ODT) 8 MG disintegrating tablet Take 1 tablet (8 mg total) by mouth every 8 (eight) hours as needed for nausea or vomiting. 06/18/20  Yes Becky Augusta, NP  mirtazapine (REMERON) 15 MG tablet Take 1 tablet (15 mg total) by mouth at bedtime. 08/21/17 06/18/20  Pucilowska, Ellin Goodie, MD  pantoprazole (PROTONIX) 40 MG tablet Take 1 tablet (40 mg total) by  mouth 2 (two) times daily. 08/21/17 09/12/19  Pucilowska, Ellin Goodie, MD  traZODone (DESYREL) 100 MG tablet Take 1 tablet (100 mg total) by mouth at bedtime as needed for sleep. 08/21/17 06/18/20  Shari Prows, MD    Family History Family History  Problem Relation Age of Onset  . Healthy Mother   . Healthy Father     Social History Social History   Tobacco Use  . Smoking status: Former Smoker    Types: Cigarettes  . Smokeless tobacco: Never Used  Vaping Use  . Vaping Use: Never used  Substance Use Topics  . Alcohol use: Yes    Comment: social  . Drug use: Yes    Types: Marijuana    Comment: last use yesterday     Allergies   Patient has no known allergies.   Review of Systems Review of Systems  Constitutional: Negative for activity change, appetite change and fever.  Gastrointestinal: Positive for abdominal pain, blood in stool, nausea and vomiting. Negative for anal bleeding.  Skin: Negative for rash.  Neurological: Negative for syncope and light-headedness.  Hematological: Negative.   Psychiatric/Behavioral: Negative.      Physical Exam Triage Vital Signs ED Triage Vitals  Enc Vitals Group     BP 06/18/20 0843 (!) 158/92     Pulse Rate 06/18/20 0843 89     Resp 06/18/20 0843 18     Temp 06/18/20 0843 99.1  F (37.3 C)     Temp Source 06/18/20 0843 Oral     SpO2 06/18/20 0843 100 %     Weight 06/18/20 0842 190 lb (86.2 kg)     Height 06/18/20 0842 5\' 11"  (1.803 m)     Head Circumference --      Peak Flow --      Pain Score 06/18/20 0842 9     Pain Loc --      Pain Edu? --      Excl. in GC? --    No data found.  Updated Vital Signs BP (!) 158/92 (BP Location: Left Arm)   Pulse 89   Temp 99.1 F (37.3 C) (Oral)   Resp 18   Ht 5\' 11"  (1.803 m)   Wt 190 lb (86.2 kg)   SpO2 100%   BMI 26.50 kg/m   Visual Acuity Right Eye Distance:   Left Eye Distance:   Bilateral Distance:    Right Eye Near:   Left Eye Near:    Bilateral Near:      Physical Exam Vitals and nursing note reviewed.  Constitutional:      General: He is not in acute distress.    Appearance: Normal appearance. He is normal weight. He is not ill-appearing.  HENT:     Head: Normocephalic and atraumatic.  Cardiovascular:     Rate and Rhythm: Normal rate and regular rhythm.     Pulses: Normal pulses.     Heart sounds: Normal heart sounds. No murmur heard. No gallop.   Pulmonary:     Effort: Pulmonary effort is normal.     Breath sounds: Normal breath sounds. No wheezing, rhonchi or rales.  Abdominal:     General: Abdomen is flat. Bowel sounds are normal.     Palpations: Abdomen is soft.     Tenderness: There is abdominal tenderness. There is no guarding or rebound.  Genitourinary:    Rectum: Normal.  Skin:    General: Skin is warm and dry.     Capillary Refill: Capillary refill takes less than 2 seconds.     Coloration: Skin is not pale.     Findings: No erythema or rash.  Neurological:     General: No focal deficit present.     Mental Status: He is alert and oriented to person, place, and time.  Psychiatric:        Mood and Affect: Mood normal.        Behavior: Behavior normal.        Thought Content: Thought content normal.        Judgment: Judgment normal.      UC Treatments / Results  Labs (all labs ordered are listed, but only abnormal results are displayed) Labs Reviewed  OCCULT BLOOD X 1 CARD TO LAB, STOOL    EKG   Radiology No results found.  Procedures Procedures (including critical care time)  Medications Ordered in UC Medications  ondansetron (ZOFRAN-ODT) disintegrating tablet 8 mg (8 mg Oral Given 06/18/20 1002)  alum & mag hydroxide-simeth (MAALOX/MYLANTA) 200-200-20 MG/5ML suspension 30 mL (30 mLs Oral Given 06/18/20 1002)    And  lidocaine (XYLOCAINE) 2 % viscous mouth solution 15 mL (15 mLs Oral Given 06/18/20 1002)    Initial Impression / Assessment and Plan / UC Course  I have reviewed the triage vital  signs and the nursing notes.  Pertinent labs & imaging results that were available during my care of the patient were reviewed by me  and considered in my medical decision making (see chart for details).   Patient is a nontoxic-appearing 25 year old male here for evaluation of continuous emesis for the past 5 days.  He states that his emesis can be a mixture of food or yellow gastric juice mixed with coffee grounds.  Patient is also complaining of epigastric abdominal pain and black stools.  Patient states he has been using Pepto-Bismol and has not seen any bright red blood in his stools.  He reports she did have an episode of syncope yesterday while he was vomiting but that was the only one.  He states he has not been able to keep down any fluids or food for the past 5 days.  Patient is nontachycardic and he is mildly hypertensive at 158/92.  Patient's clinical presentation does not match his history given.  Physical exam reveals a benign cardiopulmonary exam.  Conjunctiva are not pale.  Abdomen is soft, nondistended, with positive bowel sounds in all 4 quadrants.  Patient has epigastric tenderness without guarding or rebound.  Digital rectal exam performed.  There were no external hemorrhoids or fissures visualized.  Patient has normal rectal tone and there is no gross blood.  Stool guaiac collected and sent to lab.  Patient vies if his guaiac is positive he will need to go to the emergency department.  Stool guaiac is negative.  Will treat patient for gastritis with GI cocktail and Zofran for nausea.  We will discharge patient home on Prilosec and Zofran.   Final Clinical Impressions(s) / UC Diagnoses   Final diagnoses:  Gastritis and gastroduodenitis     Discharge Instructions     Start the Prilosec once daily for relief of your gastritis symptoms.  Stop drinking [facility] as this is probably adding to your gastritis.  Use the Zofran every 8 hours as needed for nausea and  vomiting.  If you have continued vomiting, you have another episode of passing out, you start vomiting bright red blood, or start passing blood in your stool you need to go to the ER for evaluation.    ED Prescriptions    Medication Sig Dispense Auth. Provider   omeprazole (PRILOSEC) 20 MG capsule Take 1 capsule (20 mg total) by mouth daily. 30 capsule Becky Augusta, NP   ondansetron (ZOFRAN ODT) 8 MG disintegrating tablet Take 1 tablet (8 mg total) by mouth every 8 (eight) hours as needed for nausea or vomiting. 20 tablet Becky Augusta, NP     PDMP not reviewed this encounter.   Becky Augusta, NP 06/18/20 1019

## 2020-06-18 NOTE — ED Triage Notes (Signed)
Pt c/o possibly having food poisoning from some food he ate Saturday night. Pt states he has had continuous emesis since then. Pt states he cannot keep down any food, not even 2 chips, and also cannot keep any liquid down. Pt is very loud and does not appear to be in any distress. Pt also reports headache since Tuesday.

## 2020-06-18 NOTE — Discharge Instructions (Addendum)
Start the Prilosec once daily for relief of your gastritis symptoms.  Stop drinking [facility] as this is probably adding to your gastritis.  Use the Zofran every 8 hours as needed for nausea and vomiting.  If you have continued vomiting, you have another episode of passing out, you start vomiting bright red blood, or start passing blood in your stool you need to go to the ER for evaluation.

## 2020-06-24 ENCOUNTER — Other Ambulatory Visit: Payer: Self-pay

## 2020-06-24 ENCOUNTER — Ambulatory Visit
Admission: EM | Admit: 2020-06-24 | Discharge: 2020-06-24 | Disposition: A | Discharge status: Home / Self Care | Payer: Self-pay | Attending: Family Medicine | Admitting: Family Medicine

## 2020-06-24 DIAGNOSIS — R350 Frequency of micturition: Secondary | ICD-10-CM

## 2020-06-24 LAB — COMPREHENSIVE METABOLIC PANEL
ALT: 17 U/L (ref 0–44)
AST: 16 U/L (ref 15–41)
Albumin: 4.8 g/dL (ref 3.5–5.0)
Alkaline Phosphatase: 57 U/L (ref 38–126)
Anion gap: 10 (ref 5–15)
BUN: 18 mg/dL (ref 6–20)
CO2: 24 mmol/L (ref 22–32)
Calcium: 9.4 mg/dL (ref 8.9–10.3)
Chloride: 102 mmol/L (ref 98–111)
Creatinine, Ser: 1.17 mg/dL (ref 0.61–1.24)
GFR, Estimated: >60 mL/min (ref >60)
Glucose, Bld: 91 mg/dL (ref 70–99)
Potassium: 3.7 mmol/L (ref 3.5–5.1)
Sodium: 136 mmol/L (ref 135–145)
Total Bilirubin: 0.9 mg/dL (ref 0.3–1.2)
Total Protein: 8.4 g/dL — ABNORMAL HIGH (ref 6.5–8.1)

## 2020-06-24 LAB — CBC WITH DIFFERENTIAL/PLATELET
Abs Immature Granulocytes: 0.02 10*3/uL (ref 0.00–0.07)
Basophils Absolute: 0.1 10*3/uL (ref 0.0–0.1)
Basophils Relative: 1 %
Eosinophils Absolute: 0.2 10*3/uL (ref 0.0–0.5)
Eosinophils Relative: 3 %
HCT: 44 % (ref 39.0–52.0)
Hemoglobin: 15.1 g/dL (ref 13.0–17.0)
Immature Granulocytes: 0 %
Lymphocytes Relative: 46 %
Lymphs Abs: 2.9 10*3/uL (ref 0.7–4.0)
MCH: 30.3 pg (ref 26.0–34.0)
MCHC: 34.3 g/dL (ref 30.0–36.0)
MCV: 88.2 fL (ref 80.0–100.0)
Monocytes Absolute: 0.5 10*3/uL (ref 0.1–1.0)
Monocytes Relative: 7 %
Neutro Abs: 2.7 10*3/uL (ref 1.7–7.7)
Neutrophils Relative %: 43 %
Platelets: 230 10*3/uL (ref 150–400)
RBC: 4.99 MIL/uL (ref 4.22–5.81)
RDW: 12.7 % (ref 11.5–15.5)
WBC: 6.3 10*3/uL (ref 4.0–10.5)
nRBC: 0 % (ref 0.0–0.2)

## 2020-06-24 LAB — URINALYSIS, COMPLETE (UACMP) WITH MICROSCOPIC
Glucose, UA: NEGATIVE mg/dL
Hgb urine dipstick: NEGATIVE
Ketones, ur: 15 mg/dL — AB
Leukocytes,Ua: NEGATIVE
Nitrite: NEGATIVE
Specific Gravity, Urine: 1.025 (ref 1.005–1.030)
pH: 6 (ref 5.0–8.0)

## 2020-06-24 LAB — LIPASE, BLOOD: Lipase: 30 U/L (ref 11–51)

## 2020-06-24 NOTE — Discharge Instructions (Addendum)
Labs and urine look good.  Lots of water.  Tylenol as needed for pain.  Take care  Dr. Adriana Simas

## 2020-06-24 NOTE — ED Triage Notes (Signed)
Pt presents with R sided midthoracic back pain and urinary frequency x 2 days.  Back pain is constant ache.  Urinating a few times an hour but only drops at a time.  Reports a strong odor with urination one time yesterday.  Denies hematuria or dysuria. Also expresses concern that since GI bug last week, he feels full faster.  All symptoms from that illness have resolved. Denies concern for STD, denies discharge/rash.

## 2020-06-24 NOTE — ED Provider Notes (Signed)
[facility]    CSN: [id number] Arrival date & time: 06/24/20  0818      History   Chief Complaint Chief Complaint  Patient presents with  . Back Pain  . Urinary Frequency   HPI  25 year old male presents with the above complaints.  Patient recently seen here on 5/12.  Diagnosed with gastritis.  No laboratory studies were done.  Patient reports that he continues to have some upper abdominal pain.  Reports early satiety.  He reports recent weight loss of approximately 20 pounds in 2 weeks.  Patient states that 2 days ago he developed right flank pain.  He has had urinary frequency.  No dysuria.  No fever.  Denies hematuria.  Denies penile discharge.  Patient states that he thought it would be best if he come in for evaluation.  Denies alcohol use.  No other reported symptoms.   Past Medical History:  Diagnosis Date  . Alcoholic gastritis with bleeding     Patient Active Problem List   Diagnosis Date Noted  . Cannabis use disorder, moderate, dependence (HCC) 08/19/2017  . Tobacco use disorder 08/19/2017  . Alcohol use disorder, moderate, dependence (HCC) 08/17/2017  . 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 Gastritis 08/17/2017  . Severe major depression, single episode, without psychotic features (HCC) 08/16/2017  . Benzodiazepine overdose 08/16/2017    Home Medications    Prior to Admission medications   Medication Sig Start Date End Date Taking? Authorizing Provider  omeprazole (PRILOSEC) 20 MG capsule Take 1 capsule (20 mg total) by mouth daily. 06/18/20  Yes Becky Augusta, NP  ondansetron (ZOFRAN ODT) 8 MG disintegrating tablet Take 1 tablet (8 mg total) by mouth every 8 (eight) hours as needed for nausea or vomiting. 06/18/20  Yes Becky Augusta, NP  mirtazapine (REMERON) 15 MG tablet Take 1 tablet (15 mg total) by mouth at bedtime. 08/21/17 06/18/20  Pucilowska, Ellin Goodie, MD  pantoprazole (PROTONIX) 40 MG tablet Take 1  tablet (40 mg total) by mouth 2 (two) times daily. 08/21/17 09/12/19  Pucilowska, Ellin Goodie, MD  traZODone (DESYREL) 100 MG tablet Take 1 tablet (100 mg total) by mouth at bedtime as needed for sleep. 08/21/17 06/18/20  Shari Prows, MD    Family History Family History  Problem Relation Age of Onset  . Healthy Mother   . Healthy Father     Social History Social History   Tobacco Use  . Smoking status: Former Smoker    Types: Cigarettes  . Smokeless tobacco: Never Used  Vaping Use  . Vaping Use: Never used  Substance Use Topics  . Alcohol use: Not Currently    Comment: social  . Drug use: Not Currently    Types: Marijuana    Comment: not in one month      Allergies   Patient has no known allergies.   Review of Systems Review of Systems Per HPI  Physical Exam Triage Vital Signs ED Triage Vitals  Enc Vitals Group     BP 06/24/20 0855 (!) 143/88     Pulse Rate 06/24/20 0855 (!) 58     Resp 06/24/20 0855 18     Temp 06/24/20 0855 98.3 F (36.8 C)     Temp Source 06/24/20 0855 Oral     SpO2 06/24/20 0855 100 %     Weight --      Height --      Head Circumference --  Peak Flow --      Pain Score 06/24/20 0852 7     Pain Loc --      Pain Edu? --      Excl. in GC? --    Updated Vital Signs BP (!) 143/88 (BP Location: Left Arm)   Pulse (!) 58   Temp 98.3 F (36.8 C) (Oral)   Resp 18   SpO2 100%   Visual Acuity Right Eye Distance:   Left Eye Distance:   Bilateral Distance:    Right Eye Near:   Left Eye Near:    Bilateral Near:     Physical Exam Constitutional:      General: He is not in acute distress.    Appearance: Normal appearance. He is not ill-appearing.  HENT:     Head: Normocephalic and atraumatic.  Eyes:     General:        Right eye: No discharge.        Left eye: No discharge.     Conjunctiva/sclera: Conjunctivae normal.  Cardiovascular:     Rate and Rhythm: Normal rate and regular rhythm.     Heart sounds: No murmur  heard.   Pulmonary:     Effort: Pulmonary effort is normal.     Breath sounds: Normal breath sounds. No wheezing, rhonchi or rales.  Abdominal:     General: There is no distension.     Palpations: Abdomen is soft.     Comments: Epigastric tenderness to palpation.  Neurological:     Mental Status: He is alert.  Psychiatric:        Mood and Affect: Mood normal.        Behavior: Behavior normal.    UC Treatments / Results  Labs (all labs ordered are listed, but only abnormal results are displayed) Labs Reviewed  URINALYSIS, COMPLETE (UACMP) WITH MICROSCOPIC - Abnormal; Notable for the following components:      Result Value   Bilirubin Urine SMALL (*)    Ketones, ur 15 (*)    Protein, ur TRACE (*)    Bacteria, UA RARE (*)    All other components within normal limits  COMPREHENSIVE METABOLIC PANEL - Abnormal; Notable for the following components:   Total Protein 8.4 (*)    All other components within normal limits  CBC WITH DIFFERENTIAL/PLATELET  LIPASE, BLOOD    EKG   Radiology No results found.  Procedures Procedures (including critical care time)  Medications Ordered in UC Medications - No data to display  Initial Impression / Assessment and Plan / UC Course  I have reviewed the triage vital signs and the nursing notes.  Pertinent labs & imaging results that were available during my care of the patient were reviewed by me and considered in my medical decision making (see chart for details).    25 year old male presents with urinary frequency and flank pain.  Urinalysis unremarkable.  Well-appearing on exam.  Given his recent presentation, I obtained laboratory studies as well given ongoing epigastric pain.  See previous encounter.  Laboratory studies unremarkable.  Advised to increase fluid intake.  Supportive care.  Tylenol as needed.  Final Clinical Impressions(s) / UC Diagnoses   Final diagnoses:  Urinary frequency     Discharge Instructions     Labs  and urine look good.  Lots of water.  Tylenol as needed for pain.  Take care  Dr. Adriana Simas     ED Prescriptions    None     PDMP not reviewed  this encounter.   Tommie Sams, DO 06/24/20 1009

## 2021-07-05 ENCOUNTER — Ambulatory Visit
Admission: EM | Admit: 2021-07-05 | Discharge: 2021-07-05 | Disposition: A | Discharge status: Home / Self Care | Payer: Self-pay | Attending: Student | Admitting: Student

## 2021-07-05 DIAGNOSIS — A084 Viral intestinal infection, unspecified: Secondary | ICD-10-CM | POA: Insufficient documentation

## 2021-07-05 LAB — SEDIMENTATION RATE: Sed Rate: 1 mm/hr (ref 0–15)

## 2021-07-05 MED ORDER — ONDANSETRON 8 MG PO TBDP: 8.0000 mg | ORAL_TABLET | Freq: Three times a day (TID) | ORAL | 0 refills | Status: DC | PRN

## 2021-07-05 MED ORDER — LIDOCAINE VISCOUS HCL 2 % MT SOLN
15.0000 mL | Freq: Once | OROMUCOSAL | Status: AC
  Administered 2021-07-05: 15 mL via ORAL

## 2021-07-05 MED ORDER — ALUM & MAG HYDROXIDE-SIMETH 200-200-20 MG/5ML PO SUSP
30.0000 mL | Freq: Once | ORAL | Status: AC
  Administered 2021-07-05: 30 mL via ORAL

## 2021-07-05 NOTE — ED Triage Notes (Signed)
Patient c.o headache Wednesday - Saturday.  Patient has not been able to eat since Thursday.  Patient started vomiting yesterday 12 - 5.   Patient states when he was vomiting it was yellow at first then he thinks he started throwing up blood because it became res

## 2021-07-05 NOTE — ED Provider Notes (Signed)
[facility]    CSN: [id number] Arrival date & time: 07/05/21  1012      History   Chief Complaint No chief complaint on file.   HPI Keith Dudley is a 26 y.o. male presenting with nausea, vomiting, diarrhea for about 2 days.  History alcohol use disorder, cannabis use disorder, tobacco use disorder, gastritis.  Patient notes nausea with vomiting and diarrhea for the last 2 days, multiple episodes of bilious vomiting throughout the night.  There was trace of bright red blood in the vomit 1 day ago, but none today.  He is having epigastric pain without radiation.  Tolerating minimal fluids and foods. also notes loose stool, there is no black and tarry stool or red stool.  Denies sore throat, cough, congestion, known fevers.  Has attempted Aleve on an empty stomach without relief.  Denies recent EtOH, substance use, eating at restaurants, recent travel.  Incidentally mentions he did have headaches from 5/24 through 5/27, these have resolved at the time of the visit.  He noticed that his temporal arteries seemed unusually pronounced, which has persisted.  Today there is NO headache, vision changes, vision loss, dizziness, weakness, neck stiffness, arm weakness, ear pain. No prior history abd surgeries or procedures.   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 No pertinent surgical history.     Home Medications    Prior to Admission medications   Medication Sig Start Date End  Date Taking? Authorizing Provider  omeprazole (PRILOSEC) 20 MG capsule Take 1 capsule (20 mg total) by mouth daily. 06/18/20  Yes Margarette Canada, NP  ondansetron (ZOFRAN-ODT) 8 MG disintegrating tablet Take 1 tablet (8 mg total) by mouth every 8 (eight) hours as needed for nausea or vomiting. 07/05/21  Yes Hazel Sams, PA-C  mirtazapine (REMERON) 15 MG tablet Take 1 tablet (15 mg total) by mouth at bedtime. 08/21/17 06/18/20  Pucilowska, Wardell Honour, MD  pantoprazole (PROTONIX) 40 MG tablet Take 1 tablet (40 mg total) by mouth 2 (two) times daily. 08/21/17 09/12/19  Pucilowska, Wardell Honour, MD  traZODone (DESYREL) 100 MG tablet Take 1 tablet (100 mg total) by mouth at bedtime as needed for sleep. 08/21/17 06/18/20  Clovis Fredrickson, MD    Family History Family History  Problem Relation Age of Onset   Healthy Mother    Healthy Father     Social History Social History   Tobacco Use   Smoking status: Former    Types: Cigarettes   Smokeless tobacco: Never  Vaping Use   Vaping Use: Never used  Substance Use Topics   Alcohol use: Not Currently    Comment: social   Drug use: Not Currently    Types: Marijuana    Comment: not in one month      Allergies   Patient has no known allergies.   Review of Systems Review of Systems  Constitutional:  Negative for chills and fever.  HENT:  Negative for ear pain and sore throat.   Eyes:  Negative for pain  and visual disturbance.  Respiratory:  Negative for cough and shortness of breath.   Cardiovascular:  Negative for chest pain and palpitations.  Gastrointestinal:  Positive for abdominal pain, diarrhea, nausea and vomiting.  Genitourinary:  Negative for dysuria and hematuria.  Musculoskeletal:  Negative for arthralgias and back pain.  Skin:  Negative for color change and rash.  Neurological:  Negative for seizures and syncope.  All other systems reviewed and are negative.   Physical Exam Triage Vital Signs ED Triage Vitals  Enc Vitals  Group     BP 07/05/21 1114 (!) 112/99     Pulse Rate 07/05/21 1114 76     Resp 07/05/21 1114 18     Temp 07/05/21 1114 98.6 F (37 C)     Temp Source 07/05/21 1114 Oral     SpO2 07/05/21 1114 98 %     Weight 07/05/21 1113 180 lb (81.6 kg)     Height 07/05/21 1113 _0  (1.803 m)     Head Circumference --      Peak Flow --      Pain Score 07/05/21 1113 5     Pain Loc --      Pain Edu? --      Excl. in [place]? --    No data found.  Updated Vital Signs BP (!) 112/99 (BP Location: Left Arm)   Pulse 76   Temp 98.6 F (37 C) (Oral)   Resp 18   Ht _1  (1.803 m)   Wt 180 lb (81.6 kg)   SpO2 98%   BMI 25.10 kg/m   Visual Acuity Right Eye Distance:   Left Eye Distance:   Bilateral Distance:    Right Eye Near:   Left Eye Near:    Bilateral Near:     Physical Exam Vitals reviewed.  Constitutional:      General: He is not in acute distress.    Appearance: Normal appearance. He is not ill-appearing.  HENT:     Head: Normocephalic and atraumatic.     Comments: Bilateral temporal arteries are visible along temples. There is NO tenderness, warmth, scalp tenderness. No neck stiffness. Visual acuity grossly intact.     Mouth/Throat:     Mouth: Mucous membranes are moist.     Comments: Moist mucous membranes Eyes:     Extraocular Movements: Extraocular movements intact.     Pupils: Pupils are equal, round, and reactive to light.  Cardiovascular:     Rate and Rhythm: Normal rate and regular rhythm.     Heart sounds: Normal heart sounds.  Pulmonary:     Effort: Pulmonary effort is normal.     Breath sounds: Normal breath sounds. No wheezing, rhonchi or rales.  Abdominal:     General: Bowel sounds are increased. There is no distension.     Palpations: Abdomen is soft. There is no mass.     Tenderness: There is abdominal tenderness in the epigastric area. There is no right CVA tenderness, left CVA tenderness, guarding or rebound. Negative signs include Murphy's sign, Rovsing's  sign and McBurney's sign.     Comments: Bowel sounds increased throughout.  There is epigastric tenderness to palpation, without guarding or rebound.  There is no palpable mass or hernia.  Comfortable throughout exam.  Skin:    General: Skin is warm.     Capillary Refill: Capillary refill takes 2 to 3 seconds.     Comments: Good skin turgor  Neurological:     General: No focal  deficit present.     Mental Status: He is alert and oriented to person, place, and time.  Psychiatric:        Mood and Affect: Mood normal.        Behavior: Behavior normal.     UC Treatments / Results  Labs (all labs ordered are listed, but only abnormal results are displayed) Labs Reviewed  SEDIMENTATION RATE  C-REACTIVE PROTEIN    EKG   Radiology No results found.  Procedures Procedures (including critical care time)  Medications Ordered in UC Medications  alum & mag hydroxide-simeth (MAALOX/MYLANTA) 200-200-20 MG/5ML suspension 30 mL (30 mLs Oral Given 07/05/21 1156)    And  lidocaine (XYLOCAINE) 2 % viscous mouth solution 15 mL (15 mLs Oral Given 07/05/21 1156)    Initial Impression / Assessment and Plan / UC Course  I have reviewed the triage vital signs and the nursing notes.  Pertinent labs & imaging results that were available during my care of the patient were reviewed by me and considered in my medical decision making (see chart for details).     This patient is a very pleasant 26 y.o. year old male presenting with viral gastroenteritis. Afebrile, nontachy. Appears fairly well hydrated.  Following GI cocktail, abdominal pain is significantly improved.  There was trace hematemesis last night per patient, this was bright red and has resolved today.  Abdominal pain is 5/10 without radiation, no guarding or rebound.  Will discharge with Zofran.  Stop Aleve, which he has been taking on an empty stomach for this.  Denies recent EtOH.  He incidentally mentions noticing that his temporal  arteries looked more pronounced than normal, I am able to see them, however there is no tenderness.  He is having no current headaches.  No scalp tenderness, neck stiffness, vision changes or vision loss.  We will check an ESR and CRP as a precautionary measure.  ED return precautions discussed. Patient verbalizes understanding and agreement.    Final Clinical Impressions(s) / UC Diagnoses   Final diagnoses:  Viral gastroenteritis     Discharge Instructions      -Take the Zofran (ondansetron) up to 3 times daily for nausea and vomiting. Dissolve one pill under your tongue or between your teeth and your cheek. -Drink plenty of fluids and eat a bland diet as tolerated -Follow-up if symptoms worsen instead of improve, including abdominal pain, vomiting blood, black stool (poop), etc.     ED Prescriptions     Medication Sig Dispense Auth. Provider   ondansetron (ZOFRAN-ODT) 8 MG disintegrating tablet Take 1 tablet (8 mg total) by mouth every 8 (eight) hours as needed for nausea or vomiting. 20 tablet Hazel Sams, PA-C      PDMP not reviewed this encounter.   Hazel Sams, PA-C 07/05/21 1243

## 2021-07-05 NOTE — Discharge Instructions (Addendum)
-  Take the Zofran (ondansetron) up to 3 times daily for nausea and vomiting. Dissolve one pill under your tongue or between your teeth and your cheek. -Drink plenty of fluids and eat a bland diet as tolerated -Follow-up if symptoms worsen instead of improve, including abdominal pain, vomiting blood, black stool (poop), etc.

## 2021-10-09 ENCOUNTER — Encounter: Payer: Self-pay | Admitting: Emergency Medicine

## 2021-10-09 ENCOUNTER — Other Ambulatory Visit: Payer: Self-pay

## 2021-10-09 ENCOUNTER — Ambulatory Visit
Admission: EM | Admit: 2021-10-09 | Discharge: 2021-10-09 | Disposition: A | Discharge status: Home / Self Care | Payer: Self-pay | Attending: Internal Medicine | Admitting: Internal Medicine

## 2021-10-09 DIAGNOSIS — J039 Acute tonsillitis, unspecified: Secondary | ICD-10-CM | POA: Insufficient documentation

## 2021-10-09 DIAGNOSIS — J36 Peritonsillar abscess: Secondary | ICD-10-CM | POA: Insufficient documentation

## 2021-10-09 LAB — GROUP A STREP BY PCR: Group A Strep by PCR: NOT DETECTED

## 2021-10-09 MED ORDER — ACETAMINOPHEN 160 MG/5ML PO SOLN
650.0000 mg | Freq: Once | ORAL | Status: AC
Start: 2021-10-09 — End: 2021-10-09
  Administered 2021-10-09: 650 mg via ORAL

## 2021-10-09 MED ORDER — AMOXICILLIN-POT CLAVULANATE 400-57 MG/5ML PO SUSR: ORAL | 0 refills | Status: DC

## 2021-10-09 MED ORDER — PREDNISOLONE SODIUM PHOSPHATE 15 MG/5ML PO SOLN: 60.0000 mg | Freq: Every day | ORAL | 0 refills | Status: AC

## 2021-10-09 MED ORDER — METHYLPREDNISOLONE SODIUM SUCC 40 MG IJ SOLR
40.0000 mg | Freq: Once | INTRAMUSCULAR | Status: AC
  Administered 2021-10-09: 40 mg via INTRAMUSCULAR

## 2021-10-09 NOTE — Discharge Instructions (Addendum)
Get Tylenol liquid and take 500 mg every 6 hours for pain.  Go to the ER if your throat pain gets worse.

## 2021-10-09 NOTE — ED Provider Notes (Addendum)
[facility]    CSN: [id number] Arrival date & time: 10/09/21  1007      History   Chief Complaint Chief Complaint  Patient presents with   Sore Throat   Otalgia    HPI Keith Dudley is a 26 y.o. male who presents with ST and  when he swallows radiates to his R ear x 4 days. Having trouble swallowing water due to pain. Denies fever or fatigue. No URI symptoms.     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 No pertinent surgical history.     Home Medications    Prior to Admission medications   Medication Sig Start Date End Date Taking? Authorizing Provider  amoxicillin-clavulanate (AUGMENTIN) 400-57 MG/5ML suspension 10 ml bid x 10 days 10/09/21  Yes Rodriguez-Southworth, Nettie Elm, PA-C  prednisoLONE (ORAPRED) 15 MG/5ML solution Take 20 mLs (60 mg total) by mouth daily before breakfast for 3 days. 10/10/21 10/13/21 Yes Rodriguez-Southworth, Nettie Elm, PA-C  omeprazole (PRILOSEC) 20 MG capsule Take 1 capsule (20 mg total) by mouth daily. 06/18/20   Becky Augusta, NP  ondansetron (ZOFRAN-ODT) 8 MG disintegrating tablet Take 1 tablet (8 mg total) by mouth every 8 (eight) hours as needed for nausea or vomiting. 07/05/21   Rhys Martini, PA-C  mirtazapine (REMERON) 15 MG tablet Take 1 tablet (15 mg total) by mouth at bedtime. 08/21/17 06/18/20  Pucilowska, Ellin Goodie, MD  pantoprazole (PROTONIX) 40 MG tablet Take 1 tablet (40 mg total) by mouth 2 (two) times daily. 08/21/17 09/12/19  Pucilowska,  Ellin Goodie, MD  traZODone (DESYREL) 100 MG tablet Take 1 tablet (100 mg total) by mouth at bedtime as needed for sleep. 08/21/17 06/18/20  Shari Prows, MD    Family History Family History  Problem Relation Age of Onset   Healthy Mother    Healthy Father     Social History Social History   Tobacco Use   Smoking status: Former    Types: Cigarettes   Smokeless tobacco: Never  Vaping Use   Vaping Use: Never used  Substance Use Topics   Alcohol use: Yes    Comment: social   Drug use: Not Currently    Types: Marijuana    Comment: not in one month      Allergies   Patient has no known allergies.   Review of Systems Review of Systems  Constitutional:  Positive for appetite change. Negative for chills, diaphoresis, fatigue and fever.  HENT:  Positive for sore throat and trouble swallowing. Negative for congestion, ear discharge, ear pain and rhinorrhea.   Respiratory:  Negative for cough.   Hematological:  Positive for adenopathy.     Physical Exam Triage Vital Signs ED Triage Vitals  Enc Vitals Group     BP 10/09/21 1104 (!) 141/91     Pulse Rate 10/09/21 1104 92     Resp 10/09/21 1104 16     Temp 10/09/21 1104 (!) 100.7 F (38.2 C)     Temp Source 10/09/21 1104 Oral     SpO2 10/09/21  1104 97 %     Weight 10/09/21 1103 179 lb 14.3 oz (81.6 kg)     Height 10/09/21 1103 5\' 11"  (1.803 m)     Head Circumference --      Peak Flow --      Pain Score 10/09/21 1102 10     Pain Loc --      Pain Edu? --      Excl. in [place]? --    No data found.  Updated Vital Signs BP (!) 141/91 (BP Location: Left Arm)   Pulse 92   Temp (!) 100.7 F (38.2 C) (Oral)   Resp 16   Ht 5\' 11"  (1.803 m)   Wt 179 lb 14.3 oz (81.6 kg)   SpO2 97%   BMI 25.09 kg/m   Visual Acuity Right Eye Distance:   Left Eye Distance:   Bilateral Distance:    Right Eye Near:   Left Eye Near:    Bilateral Near:     Physical Exam Vitals and nursing note reviewed.  Constitutional:       General: He is not in acute distress.    Appearance: He is not ill-appearing or toxic-appearing.     Comments: Has muffled speech  HENT:     Right Ear: There is impacted cerumen.     Left Ear: There is impacted cerumen.     Mouth/Throat:     Mouth: Mucous membranes are moist.     Tongue: No lesions.     Pharynx: Posterior oropharyngeal erythema and uvula swelling present.     Tonsils: Tonsillar exudate and tonsillar abscess present. 3+ on the right. 2+ on the left.     Comments: + uvula deviation. Has exudate on R tonsil Neck:     Comments: On R upper chain Pulmonary:     Effort: Pulmonary effort is normal.  Musculoskeletal:     Cervical back: Neck supple.  Lymphadenopathy:     Cervical: Cervical adenopathy present.  Neurological:     Mental Status: He is alert.  Psychiatric:        Mood and Affect: Mood normal.        Behavior: Behavior normal.      UC Treatments / Results  Labs (all labs ordered are listed, but only abnormal results are displayed) Labs Reviewed  GROUP A STREP BY PCR  PCR strep is negative  EKG   Radiology No results found.  Procedures Procedures (including critical care time)  Medications Ordered in UC Medications  methylPREDNISolone sodium succinate (SOLU-MEDROL) 40 mg/mL injection 40 mg (has no administration in time range)  acetaminophen (TYLENOL) 160 MG/5ML solution 650 mg (has no administration in time range)    Initial Impression / Assessment and Plan / UC Course  I have reviewed the triage vital signs and the nursing notes.  Pertinent labs  results that were available during my care of the patient were reviewed by me and considered in my medical decision making (see chart for details).  He was given Solumedrol 40 mg IM to help with the swelling of his throat.  He was given Tylenol elixir 500 mg PO here for pain.  He was able to sip some warm water while awaiting for his test results, but only 4 oz  Has exudative tonsillitis and R  peritonsillar abscess  I placed him on  Augmentin, Prednisone elixir as noted.   Needs to go to ER if he gets worse in 24h    Final Clinical Impressions(s) / UC  Diagnoses   Final diagnoses:  Exudative tonsillitis  Peritonsillar abscess     Discharge Instructions      Get Tylenol liquid and take 500 mg every 6 hours for pain.  Go to the ER if your throat pain gets worse.      ED Prescriptions     Medication Sig Dispense Auth. Provider   amoxicillin-clavulanate (AUGMENTIN) 400-57 MG/5ML suspension 10 ml bid x 10 days 200 mL Rodriguez-Southworth, Fleming Prill, PA-C   prednisoLONE (ORAPRED) 15 MG/5ML solution Take 20 mLs (60 mg total) by mouth daily before breakfast for 3 days. 60 mL Rodriguez-Southworth, Nettie Elm, PA-C      PDMP not reviewed this encounter.   Garey Ham, PA-C 10/09/21 1141    Rodriguez-Southworth, [place], PA-C 10/09/21 1157

## 2021-10-09 NOTE — ED Triage Notes (Signed)
Pt c/o sore throat and right ear pain. Started about 4 days ago. He states it is hard to swallow water. Denies fever.

## 2021-10-26 ENCOUNTER — Ambulatory Visit
Admission: EM | Admit: 2021-10-26 | Discharge: 2021-10-26 | Disposition: A | Discharge status: Home / Self Care | Payer: BC Managed Care – PPO | Attending: Emergency Medicine | Admitting: Emergency Medicine

## 2021-10-26 ENCOUNTER — Ambulatory Visit (INDEPENDENT_AMBULATORY_CARE_PROVIDER_SITE_OTHER): Payer: BC Managed Care – PPO

## 2021-10-26 ENCOUNTER — Encounter: Payer: Self-pay | Admitting: Emergency Medicine

## 2021-10-26 DIAGNOSIS — R590 Localized enlarged lymph nodes: Secondary | ICD-10-CM | POA: Diagnosis not present

## 2021-10-26 DIAGNOSIS — Z8709 Personal history of other diseases of the respiratory system: Secondary | ICD-10-CM | POA: Diagnosis not present

## 2021-10-26 DIAGNOSIS — R221 Localized swelling, mass and lump, neck: Secondary | ICD-10-CM

## 2021-10-26 DIAGNOSIS — J039 Acute tonsillitis, unspecified: Secondary | ICD-10-CM | POA: Diagnosis not present

## 2021-10-26 DIAGNOSIS — Z113 Encounter for screening for infections with a predominantly sexual mode of transmission: Secondary | ICD-10-CM | POA: Diagnosis not present

## 2021-10-26 LAB — GROUP A STREP BY PCR: Group A Strep by PCR: NOT DETECTED

## 2021-10-26 MED ORDER — PREDNISOLONE 15 MG/5ML PO SOLN: 60.0000 mg | Freq: Every day | ORAL | 0 refills | Status: AC

## 2021-10-26 MED ORDER — CEFDINIR 250 MG/5ML PO SUSR: 300.0000 mg | Freq: Two times a day (BID) | ORAL | 0 refills | Status: AC

## 2021-10-26 NOTE — ED Triage Notes (Addendum)
Pt c/o sore throat. He was seen for this on 10/09/21 and treated peritonsil abscess. He states it started back yesterday. Denies fever. Pt states he completed both prednisone and antibiotics.

## 2021-10-26 NOTE — Discharge Instructions (Addendum)
Your ultrasound did not show an organized abscess but it did show that you have prominent lymph nodes on the right side of your neck.  I went to take the cefdinir twice daily for 10 days for treatment of your tonsillitis.  Take the prednisone once daily to help with swelling and inflammation.  I have made a referral to [facility] for them to evaluate you should this problem continue.  They will contact you to make an appointment.  If you develop increased pain, swelling of the back of your throat, difficulty swallowing, difficulty opening your mouth, or fever you need to go to the ER for evaluation.  Also if you develop tightness in your throat you need to go to the ER.

## 2021-10-26 NOTE — ED Provider Notes (Addendum)
[facility]    CSN: [id number] Arrival date & time: 10/26/21  1047      History   Chief Complaint Chief Complaint  Patient presents with   Sore Throat    HPI Keith Dudley is a 26 y.o. male.   HPI  26 year old male here for evaluation of sore throat.  Patient was evaluated in this urgent care on 10/09/2021 for sore throat and was diagnosed with exudative tonsillitis and peritonsillar abscess on the right.  He was discharged home on Augmentin and prednisone.  He reports that he finished all of his antibiotics and steroids but his symptoms returned last night.  He denies any fever, runny nose, nasal congestion.  His sore throat is isolated on the right.  He does have a muffled voice and states that it is difficult to swallow and painful to swallow water.  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 No pertinent surgical history.     Home Medications    Prior to Admission medications   Medication Sig Start Date End Date Taking? Authorizing Provider  cefdinir (OMNICEF) 250 MG/5ML suspension Take 6 mLs (300 mg total) by mouth 2 (two) times daily for 10 days. 10/26/21 11/05/21 Yes Becky Augusta, NP  prednisoLONE (PRELONE) 15 MG/5ML SOLN Take 20 mLs (60 mg total) by mouth daily before breakfast for 5 days. 10/26/21 10/31/21 Yes Becky Augusta, NP  omeprazole (PRILOSEC) 20 MG capsule Take 1 capsule (20 mg total) by mouth daily. 06/18/20   Becky Augusta, NP  ondansetron (ZOFRAN-ODT) 8 MG  disintegrating tablet Take 1 tablet (8 mg total) by mouth every 8 (eight) hours as needed for nausea or vomiting. 07/05/21   Rhys Martini, PA-C  mirtazapine (REMERON) 15 MG tablet Take 1 tablet (15 mg total) by mouth at bedtime. 08/21/17 06/18/20  Pucilowska, Ellin Goodie, MD  pantoprazole (PROTONIX) 40 MG tablet Take 1 tablet (40 mg total) by mouth 2 (two) times daily. 08/21/17 09/12/19  Pucilowska, Ellin Goodie, MD  traZODone (DESYREL) 100 MG tablet Take 1 tablet (100 mg total) by mouth at bedtime as needed for sleep. 08/21/17 06/18/20  Shari Prows, MD    Family History Family History  Problem Relation Age of Onset   Healthy Mother    Healthy Father     Social History Social History   Tobacco Use   Smoking status: Former    Types: Cigarettes   Smokeless tobacco: Never  Vaping Use   Vaping Use: Never used  Substance Use Topics   Alcohol use: Yes    Comment: social   Drug use: Not Currently    Types: Marijuana    Comment: not in one month      Allergies   Patient has no known allergies.   Review of Systems Review of Systems  Constitutional:  Negative for fever.  HENT:  Positive for sore throat and trouble swallowing. Negative for congestion and rhinorrhea.      Physical Exam Triage Vital Signs ED Triage Vitals  Enc Vitals Group  BP 10/26/21 1109 130/77     Pulse Rate 10/26/21 1109 80     Resp 10/26/21 1109 16     Temp 10/26/21 1109 99 F (37.2 C)     Temp Source 10/26/21 1109 Oral     SpO2 10/26/21 1109 98 %     Weight 10/26/21 1108 179 lb 14.3 oz (81.6 kg)     Height 10/26/21 1108 5\' 11"  (1.803 m)     Head Circumference --      Peak Flow --      Pain Score 10/26/21 1107 9     Pain Loc --      Pain Edu? --      Excl. in GC? --    No data found.  Updated Vital Signs BP 130/77 (BP Location: Right Arm)   Pulse 80   Temp 99 F (37.2 C) (Oral)   Resp 16   Ht 5\' 11"  (1.803 m)   Wt 179 lb 14.3 oz (81.6 kg)   SpO2 98%   BMI 25.09 kg/m   Visual  Acuity Right Eye Distance:   Left Eye Distance:   Bilateral Distance:    Right Eye Near:   Left Eye Near:    Bilateral Near:     Physical Exam Vitals and nursing note reviewed.  Constitutional:      Appearance: Normal appearance. He is not ill-appearing.  HENT:     Head: Normocephalic and atraumatic.     Right Ear: Tympanic membrane, ear canal and external ear normal. There is no impacted cerumen.     Left Ear: Tympanic membrane, ear canal and external ear normal.     Mouth/Throat:     Mouth: Mucous membranes are moist.     Pharynx: Oropharyngeal exudate and posterior oropharyngeal erythema present.  Cardiovascular:     Rate and Rhythm: Normal rate and regular rhythm.     Pulses: Normal pulses.     Heart sounds: Normal heart sounds. No murmur heard.    No friction rub. No gallop.  Pulmonary:     Effort: Pulmonary effort is normal.     Breath sounds: Normal breath sounds. No wheezing, rhonchi or rales.  Musculoskeletal:     Cervical back: Normal range of motion and neck supple. Tenderness present.  Lymphadenopathy:     Cervical: Cervical adenopathy present.  Skin:    General: Skin is warm and dry.     Capillary Refill: Capillary refill takes less than 2 seconds.     Findings: No erythema or rash.  Neurological:     General: No focal deficit present.     Mental Status: He is alert and oriented to person, place, and time.  Psychiatric:        Mood and Affect: Mood normal.        Behavior: Behavior normal.        Thought Content: Thought content normal.        Judgment: Judgment normal.      UC Treatments / Results  Labs (all labs ordered are listed, but only abnormal results are displayed) Labs Reviewed  GROUP A STREP BY PCR    EKG   Radiology US SOFT TISSUE HEAD & NECK (NON-THYROID)  Result Date: 10/26/2021 CLINICAL DATA:  26 year old male with history of tonsillar abscess with right neck swelling and tenderness. EXAM: ULTRASOUND OF HEAD/NECK SOFT TISSUES  TECHNIQUE: Ultrasound examination of the head and neck soft tissues was performed in the area of clinical concern. COMPARISON:  10/14/2017 FINDINGS: Multifocal  bilateral prominent cervical lymph nodes, larger on the right greater than left with short axis measuring up to 1.3 cm. No evidence of organized fluid collections. IMPRESSION: Right greater than left prominent cervical lymph nodes, favored to be reactive in the setting of tonsillar abscess. Electronically Signed   By: Ruthann Cancer M.D.   On: 10/26/2021 12:06    Procedures Procedures (including critical care time)  Medications Ordered in UC Medications - No data to display  Initial Impression / Assessment and Plan / UC Course  I have reviewed the triage vital signs and the nursing notes.  Pertinent labs & imaging results that were available during my care of the patient were reviewed by me and considered in my medical decision making (see chart for details).   Patient is a pleasant, nontoxic-appearing 25 year old male here for evaluation of recurrent pain and swelling in the right side of his throat that began yesterday.  He was initially seen and treated for exudative tonsillitis and peritonsillar abscess on 10/09/2021.  He was placed on 10-day course of Augmentin and also prednisone.  He states he completed all of the medication and then his symptoms began yesterday.  On exam patient has a muffled voice but he is able to speak in full sentences.  He is also able to manage his own secretions.  The last time he had pain radiating to his right ear but he denies pain in his ear this time around.  Otoscopic exam reveals pearly-gray tympanic membranes bilaterally.  Both external auditory canals are moderately ceruminous.  Oropharyngeal exam reveals 3+ edematous right tonsillar pillar with exudate.  The right tonsil is touching the uvula.  There is no uvular swelling present.  The left tonsil is mildly edematous with exudate.  Patient does have  bilateral anterior cervical lymphadenopathy.  I do not palpate a fluid collection externally.  I am suspicious that he has a recurrent peritonsillar abscess and I will order an ultrasound soft tissue neck to evaluate for a fluid collection.  Strep PCR is negative.  Radiology read of soft tissue neck ultrasound is as follows:  Multifocal bilateral prominent cervical lymph nodes, larger on the right greater than left with short axis measuring up to 1.3 cm. No evidence of organized fluid collections.  With no definitive fluid collection I do not feel need to send the patient to the ER for emergent ENT evaluation.  I will discharge him home on cefdinir and a second round of prednisone and make referral to ENT.  Prior to discharge as the patient regarding any history of unprotected oral sex and he indicated that he had unprotected oral sex with a male partner but that was in [place].  He denies any recent sexual encounters.  I will swab the patient for gonorrhea, chlamydia, and trichomonas with an oral swab and send it for analysis.   Final Clinical Impressions(s) / UC Diagnoses   Final diagnoses:  Exudative tonsillitis     Discharge Instructions      Your ultrasound did not show an organized abscess but it did show that you have prominent lymph nodes on the right side of your neck.  I went to take the cefdinir twice daily for 10 days for treatment of your tonsillitis.  Take the prednisone once daily to help with swelling and inflammation.  I have made a referral to [facility] for them to evaluate you should this problem continue.  They will contact you to make an appointment.  If you develop increased  pain, swelling of the back of your throat, difficulty swallowing, difficulty opening your mouth, or fever you need to go to the ER for evaluation.  Also if you develop tightness in your throat you need to go to the ER.     ED Prescriptions     Medication Sig Dispense Auth. Provider    cefdinir (OMNICEF) 250 MG/5ML suspension Take 6 mLs (300 mg total) by mouth 2 (two) times daily for 10 days. 120 mL Becky Augusta, NP   prednisoLONE (PRELONE) 15 MG/5ML SOLN Take 20 mLs (60 mg total) by mouth daily before breakfast for 5 days. 100 mL Becky Augusta, NP      PDMP not reviewed this encounter.   Becky Augusta, NP 10/26/21 1226    Becky Augusta, NP 10/26/21 [phone]

## 2021-10-27 LAB — CYTOLOGY, (ORAL, ANAL, URETHRAL) ANCILLARY ONLY
Chlamydia: NEGATIVE
Comment: NEGATIVE
Comment: NEGATIVE
Comment: NORMAL
Neisseria Gonorrhea: NEGATIVE
Trichomonas: NEGATIVE

## 2021-11-10 ENCOUNTER — Ambulatory Visit
Admission: EM | Admit: 2021-11-10 | Discharge: 2021-11-10 | Disposition: A | Discharge status: Home / Self Care | Payer: BC Managed Care – PPO | Attending: Family Medicine | Admitting: Family Medicine

## 2021-11-10 DIAGNOSIS — J039 Acute tonsillitis, unspecified: Secondary | ICD-10-CM | POA: Diagnosis not present

## 2021-11-10 MED ORDER — CEFDINIR 300 MG PO CAPS: 300.0000 mg | ORAL_CAPSULE | Freq: Two times a day (BID) | ORAL | 0 refills | Status: AC

## 2021-11-10 NOTE — Discharge Instructions (Signed)
Stop by the pharmacy to pick up your prescriptions.  Follow up with your primary care provider as needed.  Based on concerns about condition, if you do not follow up in the ED, you may risk poor outcomes including worsening of condition, delayed treatment and potentially life threatening issues. If you have declined to go to the ED at this time, you should call your PCP immediately to set up a follow up appointment.   Go to ED for red flag symptoms, including; fevers you cannot reduce with Tylenol/Motrin, severe headaches, vision changes, numbness/weakness in part of the body, lethargy, confusion, intractable vomiting, severe dehydration, chest pain, breathing difficulty, severe persistent abdominal or pelvic pain, signs of severe infection (increased redness, swelling of an area), feeling faint or passing out, dizziness, etc. You should especially go to the ED for sudden acute worsening of condition if you do not elect to go at this time.   

## 2021-11-10 NOTE — ED Triage Notes (Addendum)
Pt states he was seen 10/26/21 for exudative tonsillitis. Pt states throat pain eased some while on abx but not gone yet. Pt states sore throat worsened Monday, hurting to swallow. Pt reports pain all on RT side of throat, pt states he is drinking fluids & solids but hurts a lot to swallow.  Pt states he has appointment with ENT this Friday.

## 2021-11-10 NOTE — ED Provider Notes (Signed)
[facility]    CSN: [id number] Arrival date & time: 11/10/21  1803      History   Chief Complaint Chief Complaint  Patient presents with   Sore Throat    HPI Keith Dudley is a 26 y.o. male.   HPI   [place] seen 2 other times at urgent care for exudative tonsillitis and peritonsillar abscess.  He was prescribed antibiotics at both of those visits.  He completed his antibiotic therapy on Monday.  He states that is when his symptoms returned.  He continues to have a sore throat and painful swallowing.  Pain is mostly on the right side of his throat.  He has been drinking fluids and solids but it just hurts to do so.  He has not been drooling.  He has an appointment with ear nose and throat on Friday.  Denies fever, shortness of breath, chest pain, headache, jaw pain or dental pain.  There has not been any nausea, vomiting or diarrhea.     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 No pertinent surgical history.     Home Medications    Prior to Admission medications   Medication Sig Start Date End Date Taking? Authorizing Provider  cefdinir (OMNICEF) 300 MG capsule Take 1 capsule (300 mg total) by mouth 2 (two) times daily for 7 days. 11/10/21 11/17/21 Yes Tricia Oaxaca, DO  omeprazole (PRILOSEC) 20 MG capsule Take 1 capsule (20 mg total) by mouth daily. 06/18/20   Margarette Canada, NP  ondansetron (ZOFRAN-ODT) 8 MG disintegrating tablet Take 1 tablet (8 mg total) by mouth  every 8 (eight) hours as needed for nausea or vomiting. 07/05/21   Hazel Sams, PA-C  mirtazapine (REMERON) 15 MG tablet Take 1 tablet (15 mg total) by mouth at bedtime. 08/21/17 06/18/20  Pucilowska, Wardell Honour, MD  pantoprazole (PROTONIX) 40 MG tablet Take 1 tablet (40 mg total) by mouth 2 (two) times daily. 08/21/17 09/12/19  Pucilowska, Wardell Honour, MD  traZODone (DESYREL) 100 MG tablet Take 1 tablet (100 mg total) by mouth at bedtime as needed for sleep. 08/21/17 06/18/20  Clovis Fredrickson, MD    Family History Family History  Problem Relation Age of Onset   Healthy Mother    Healthy Father     Social History Social History   Tobacco Use   Smoking status: Former    Types: Cigarettes   Smokeless tobacco: Never  Vaping Use   Vaping Use: Never used  Substance Use Topics   Alcohol use: Yes    Comment: social   Drug use: Not Currently    Types: Marijuana    Comment: not in one month      Allergies   Patient has no known allergies.   Review of Systems Review of Systems: negative unless otherwise stated in HPI.      Physical Exam Triage Vital Signs ED Triage Vitals  Enc Vitals Group     BP 11/10/21 1818 138/87     Pulse Rate 11/10/21 1818 91  Resp --      Temp 11/10/21 1818 99.5 F (37.5 C)     Temp Source 11/10/21 1818 Oral     SpO2 11/10/21 1818 97 %     Weight 11/10/21 1817 173 lb (78.5 kg)     Height 11/10/21 1817 5\' 11"  (1.803 m)     Head Circumference --      Peak Flow --      Pain Score 11/10/21 1816 10     Pain Loc --      Pain Edu? --      Excl. in GC? --    No data found.  Updated Vital Signs BP 138/87 (BP Location: Right Arm)   Pulse 91   Temp 99.5 F (37.5 C) (Oral)   Ht 5\' 11"  (1.803 m)   Wt 78.5 kg   SpO2 97%   BMI 24.13 kg/m   Visual Acuity Right Eye Distance:   Left Eye Distance:   Bilateral Distance:    Right Eye Near:   Left Eye Near:    Bilateral Near:     Physical Exam GEN:     alert, non-toxic appearing male in no  distress     HENT:  mucus membranes moist, uvula displaced to the left, oropharyngeal right tonsillar mass, muffled voice, posterior oropharyngeal erythema, no nasal discharge, bilateral TM normal EYES:   pupils equal and reactive, EOMi, no scleral injection NECK:  normal ROM, +lymphadenopathy,  no meningismus   RESP:  no increased work of breathing,  clear to auscultation bilaterally CVS:   regular rate  and rhythm Skin:   warm and dry, no rash on visible skin , normal  skin turgor    UC Treatments / Results  Labs (all labs ordered are listed, but only abnormal results are displayed) Labs Reviewed - No data to display  EKG   Radiology No results found.  Procedures Procedures (including critical care time)  Medications Ordered in UC Medications - No data to display  Initial Impression / Assessment and Plan / UC Course  I have reviewed the triage vital signs and the nursing notes.  Pertinent labs & imaging results that were available during my care of the patient were reviewed by me and considered in my medical decision making (see chart for details).       Pt is a 26 y.o. male who was recently treated for exudative tonsillitis and peritonsillar abscess presents for acute flare of his recent tonsillitis.  He completed his antibiotics however his symptoms returned once he stopped them.  Recommended ED evaluation however patient declined.  Ordered CT soft tissue neck to better evaluate for possible peritonsillar abscess however patient's insurance would cover very little of this.  Patient would like to avoid this cost.  He has a appointment with ENT on Friday at the [facility].  As this appointment is only 2 days we will give him antibiotics to cover him through this time.  Strict ED precautions which patient voiced back to me were given.  Restart cefdinir.  Patient is to follow-up with ENT as scheduled.   Discussed MDM, treatment plan and plan for follow-up with  patient who agrees with plan.     Final Clinical Impressions(s) / UC Diagnoses   Final diagnoses:  Acute tonsillitis, unspecified etiology     Discharge Instructions      Stop by the pharmacy to pick up your prescriptions.  Follow up with your primary care provider as needed.  Based  on concerns about condition, if you do not follow up in the ED, you may risk poor outcomes including worsening of condition, delayed treatment and potentially life threatening issues. If you have declined to go to the ED at this time, you should call your PCP immediately to set up a follow up appointment.   Go to ED for red flag symptoms, including; fevers you cannot reduce with Tylenol/Motrin, severe headaches, vision changes, numbness/weakness in part of the body, lethargy, confusion, intractable vomiting, severe dehydration, chest pain, breathing difficulty, severe persistent abdominal or pelvic pain, signs of severe infection (increased redness, swelling of an area), feeling faint or passing out, dizziness, etc. You should especially go to the ED for sudden acute worsening of condition if you do not elect to go at this time.       ED Prescriptions     Medication Sig Dispense Auth. Provider   cefdinir (OMNICEF) 300 MG capsule Take 1 capsule (300 mg total) by mouth 2 (two) times daily for 7 days. 14 capsule Wynelle Dreier, DO      PDMP not reviewed this encounter.   Katha Cabal, DO 11/10/21 2228

## 2021-11-12 ENCOUNTER — Encounter: Payer: Self-pay | Admitting: Nurse Practitioner

## 2021-11-12 ENCOUNTER — Ambulatory Visit (INDEPENDENT_AMBULATORY_CARE_PROVIDER_SITE_OTHER): Payer: BC Managed Care – PPO | Admitting: Nurse Practitioner

## 2021-11-12 VITALS — BP 122/73 | HR 64 | Ht 71.0 in | Wt 173.9 lb

## 2021-11-12 DIAGNOSIS — J039 Acute tonsillitis, unspecified: Secondary | ICD-10-CM | POA: Diagnosis not present

## 2021-11-12 NOTE — Progress Notes (Signed)
New Patient Office Visit  Subjective    Patient ID: Keith Dudley, male    DOB: [birth year]  Age: 26 y.o. MRN: [id number]  CC:  Chief Complaint  Patient presents with   Sore Throat    HPI Nathanial Arrighi presents to establish care. He has tonsillitis going on from a month and have been to the urgent care three times at [facility]. Denies runny nose, or cough.  He is still on antibiotic.  Patient thought that this is his  ENT appointment.  Patient was informed that this is a primary care appointment to establish care.  No ENT referral was placed previously.    Outpatient Encounter Medications as of 11/12/2021  Medication Sig   [EXPIRED] cefdinir (OMNICEF) 300 MG capsule Take 1 capsule (300 mg total) by mouth 2 (two) times daily for 7 days.   [DISCONTINUED] mirtazapine (REMERON) 15 MG tablet Take 1 tablet (15 mg total) by mouth at bedtime.   [DISCONTINUED] omeprazole (PRILOSEC) 20 MG capsule Take 1 capsule (20 mg total) by mouth daily.   [DISCONTINUED] ondansetron (ZOFRAN-ODT) 8 MG disintegrating tablet Take 1 tablet (8 mg total) by mouth every 8 (eight) hours as needed for nausea or vomiting.   [DISCONTINUED] pantoprazole (PROTONIX) 40 MG tablet Take 1 tablet (40 mg total) by mouth 2 (two) times daily.   [DISCONTINUED] traZODone (DESYREL) 100 MG tablet Take 1 tablet (100 mg total) by mouth at bedtime as needed for sleep.   No facility-administered encounter medications on file as of 11/12/2021.    Past Medical History:  Diagnosis Date   Alcoholic gastritis with bleeding     History reviewed. No pertinent surgical history.  Family History  Problem Relation Age of Onset   Healthy Mother    Healthy Father     Social History   Socioeconomic History   Marital status: Single    Spouse name: Not on file   Number of children: Not on file   Years of education: Not on file   Highest education level: Not on file  Occupational History   Not on file  Tobacco Use   Smoking status: Former     Types: Cigarettes   Smokeless tobacco: Never  Vaping Use   Vaping Use: Never used  Substance and Sexual Activity   Alcohol use: Yes    Comment: social   Drug use: Yes    Types: Marijuana    Comment: occasionaly   Sexual activity: Not Currently    Comment: since July  Other Topics Concern   Not on file  Social History Narrative   Not on file   Social Determinants of Health   Financial Resource Strain: Not on file  Food Insecurity: Not on file  Transportation Needs: Not on file  Physical Activity: Not on file  Stress: Not on file  Social Connections: Not on file  Intimate Partner Violence: Not on file    Review of Systems  Constitutional: Negative.   HENT:  Positive for sore throat.   Respiratory: Negative.    Cardiovascular: Negative.   Genitourinary: Negative.   Musculoskeletal: Negative.   Skin: Negative.   Neurological: Negative.   Psychiatric/Behavioral: Negative.  Negative for depression, substance abuse and suicidal ideas.         Objective    BP 122/73   Pulse 64   Ht 5\' 11"  (1.803 m)   Wt 173 lb 14.4 oz (78.9 kg)   BMI 24.25 kg/m   Physical Exam Constitutional:  Appearance: He is well-developed and normal weight.  HENT:     Right Ear: Tympanic membrane normal.     Left Ear: Tympanic membrane normal.     Mouth/Throat:     Mouth: Mucous membranes are moist.     Pharynx: Uvula midline.     Tonsils: 2+ on the right. 2+ on the left.  Cardiovascular:     Rate and Rhythm: Normal rate and regular rhythm.     Heart sounds: Normal heart sounds.  Pulmonary:     Effort: Pulmonary effort is normal.  Abdominal:     General: There is no distension.     Palpations: Abdomen is soft. There is no mass.  Skin:    General: Skin is warm.  Neurological:     General: No focal deficit present.     Mental Status: He is alert and oriented to person, place, and time.  Psychiatric:        Mood and Affect: Mood normal.        Behavior: Behavior normal.          Assessment & Plan:   Problem List Items Addressed This Visit       Respiratory   Tonsillitis - Primary    Advised patient to continue take [place]. No ENT referral was placed during the previous visit at the urgent care. Referral placed for ENT.      Relevant Orders   Ambulatory referral to ENT   Ambulatory referral to ENT    No follow-ups on file.   Theresia Lo, NP

## 2021-11-27 ENCOUNTER — Encounter: Payer: Self-pay | Admitting: Emergency Medicine

## 2021-11-27 ENCOUNTER — Ambulatory Visit
Admission: EM | Admit: 2021-11-27 | Discharge: 2021-11-27 | Disposition: A | Discharge status: Home / Self Care | Payer: BC Managed Care – PPO | Attending: Family Medicine | Admitting: Family Medicine

## 2021-11-27 DIAGNOSIS — J0391 Acute recurrent tonsillitis, unspecified: Secondary | ICD-10-CM

## 2021-11-27 MED ORDER — CEFDINIR 300 MG PO CAPS: 300.0000 mg | ORAL_CAPSULE | Freq: Two times a day (BID) | ORAL | 0 refills | Status: AC

## 2021-11-27 MED ORDER — CEFDINIR 300 MG PO CAPS: 300.0000 mg | ORAL_CAPSULE | Freq: Two times a day (BID) | ORAL | Status: DC

## 2021-11-27 NOTE — ED Triage Notes (Signed)
Patient c/o sore throat and swelling that started yesterday.  Patient states he does not have an appointment for ENT until November.  Patient denies fevers.

## 2021-11-27 NOTE — Discharge Instructions (Addendum)
Stop by the pharmacy to pick up your prescriptions.  Follow up with ENT as scheduled.    Based on concerns about condition, if you do not follow up in the ED, you may risk poor outcomes including worsening of condition, delayed treatment and potentially life threatening issues. If you have declined to go to the ED at this time, you should call your PCP immediately to set up a follow up appointment.   Go to ED for red flag symptoms, including; fevers you cannot reduce with Tylenol/Motrin, severe headaches, vision changes, numbness/weakness in part of the body, lethargy, confusion, intractable vomiting, severe dehydration, chest pain, breathing difficulty, severe persistent abdominal or pelvic pain, signs of severe infection (increased redness, swelling of an area), feeling faint or passing out, dizziness, etc. You should especially go to the ED for sudden acute worsening of condition if you do not elect to go at this time.

## 2021-11-27 NOTE — ED Provider Notes (Signed)
[facility]    CSN: [id number] Arrival date & time: 11/27/21  1148      History   Chief Complaint Chief Complaint  Patient presents with   Sore Throat    HPI Keith Dudley is a 26 y.o. male.   HPI   Demarqus presents for continuing tonsillitis.  He has been seen in the urgent care 3 other times for the same.  There is concern that he has a peritonsillar abscess.  He has been prescribed antibiotics during these visits.  He completed his antibiotics on Wednesday and reports his symptoms started coming back last night.  Continues to have sore throat and painful swallowing.  No drooling, fever, chills, nausea, vomiting, diarrhea, shortness of breath, jaw pain, neck pain or headache.  States he was seen at the [facility] medical clinic but this was the wrong location and he was scheduled to see someone else in November.  He has an appointment on November 2.       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 No pertinent surgical history.     Home Medications    Prior to Admission medications   Medication Sig Start Date End Date Taking? Authorizing Provider  mirtazapine (REMERON) 15 MG tablet Take 1 tablet (15 mg total) by mouth at bedtime. 08/21/17 06/18/20  Pucilowska, Ellin Goodie, MD  pantoprazole (PROTONIX) 40 MG tablet Take 1 tablet (40 mg total) by mouth 2 (two) times daily. 08/21/17 09/12/19  Pucilowska, Ellin Goodie, MD  traZODone (DESYREL) 100 MG tablet Take 1 tablet (100 mg total) by  mouth at bedtime as needed for sleep. 08/21/17 06/18/20  PucilowskaEllin Goodie, MD    Family History Family History  Problem Relation Age of Onset   Healthy Mother    Healthy Father     Social History Social History   Tobacco Use   Smoking status: Former    Types: Cigarettes   Smokeless tobacco: Never  Vaping Use   Vaping Use: Never used  Substance Use Topics   Alcohol use: Yes    Comment: social   Drug use: Yes    Types: Marijuana    Comment: occasionaly     Allergies   Patient has no known allergies.   Review of Systems Review of Systems: negative unless otherwise stated in HPI.      Physical Exam Triage Vital Signs ED Triage Vitals  Enc Vitals Group     BP 11/27/21 1237 121/86     Pulse Rate 11/27/21 1237 66     Resp 11/27/21 1237 15     Temp 11/27/21 1237 98.1 F (36.7 C)     Temp Source 11/27/21 1237 Oral     SpO2 11/27/21 1237 100 %     Weight 11/27/21 1235 177 lb (80.3 kg)     Height 11/27/21 1235 5\' 11"  (1.803 m)     Head Circumference --      Peak Flow --      Pain Score 11/27/21 1234 9     Pain Loc --  Pain Edu? --      Excl. in [place]? --    No data found.  Updated Vital Signs BP 121/86 (BP Location: Right Arm)   Pulse 66   Temp 98.1 F (36.7 C) (Oral)   Resp 15   Ht 5\' 11"  (1.803 m)   Wt 80.3 kg   SpO2 100%   BMI 24.69 kg/m   Visual Acuity Right Eye Distance:   Left Eye Distance:   Bilateral Distance:    Right Eye Near:   Left Eye Near:    Bilateral Near:     Physical Exam GEN:     alert, non-toxic appearing male in no distress     HENT:  mucus membranes moist, uvula displaced to the left due to right tonsillar mass, muffled voice has somewhat improved since previous, no nasal discharge, continues to have mild posterior oropharyngeal erythema, no active drainage  EYES:   pupils equal and reactive, wearing glasses,  no scleral injection NECK:  normal ROM, +lymphadenopathy RESP:  no increased work of breathing, CVS:    regular rate  Skin:   warm and dry, no rash on visible skin    UC Treatments / Results  Labs (all labs ordered are listed, but only abnormal results are displayed) Labs Reviewed - No data to display  EKG   Radiology No results found.  Procedures Procedures (including critical care time)  Medications Ordered in UC Medications  cefdinir (OMNICEF) capsule 300 mg (has no administration in time range)    Initial Impression / Assessment and Plan / UC Course  I have reviewed the triage vital signs and the nursing notes.  Pertinent labs & imaging results that were available during my care of the patient were reviewed by me and considered in my medical decision making (see chart for details).       Pt is a 26 y.o. male who was recently treated for exudative tonsillitis and peritonsillar abscess presents for acute flare of his recent tonsillitis.  He completed his antibiotics however his symptoms returned once he stopped them.  Again recommended ED evaluation however patient declined.  Had an appointment at [facility] however this was the wrong location and he had to be seen at somewhere else.  On chart review, he was seen on 11/12/2021 however the note is still open and no plan has been entered.  He has a appointment with ENT on member the second.  We will refill his cefdinir until he can get to this appointment.  Strict ED precautions which patient voiced back to me were given.  Restart cefdinir.  Patient is to follow-up with ENT as scheduled.     Discussed MDM, treatment plan and plan for follow-up with patient who agrees with plan.     Final Clinical Impressions(s) / UC Diagnoses   Final diagnoses:  Recurrent tonsillitis     Discharge Instructions      Stop by the pharmacy to pick up your prescriptions.  Follow up with ENT as scheduled.    Based on concerns about condition, if you do not follow up in the ED, you may risk poor outcomes including worsening of  condition, delayed treatment and potentially life threatening issues. If you have declined to go to the ED at this time, you should call your PCP immediately to set up a follow up appointment.   Go to ED for red flag symptoms, including; fevers you cannot reduce with Tylenol/Motrin, severe headaches, vision changes, numbness/weakness in part  of the body, lethargy, confusion, intractable vomiting, severe dehydration, chest pain, breathing difficulty, severe persistent abdominal or pelvic pain, signs of severe infection (increased redness, swelling of an area), feeling faint or passing out, dizziness, etc. You should especially go to the ED for sudden acute worsening of condition if you do not elect to go at this time.      ED Prescriptions   None    PDMP not reviewed this encounter.   Katha Cabal, DO 11/27/21 1254

## 2021-12-02 ENCOUNTER — Encounter: Payer: Self-pay | Admitting: Nurse Practitioner

## 2021-12-02 DIAGNOSIS — J039 Acute tonsillitis, unspecified: Secondary | ICD-10-CM | POA: Insufficient documentation

## 2021-12-02 HISTORY — DX: Acute tonsillitis, unspecified: J03.90

## 2021-12-02 NOTE — Assessment & Plan Note (Signed)
Advised patient to continue take [place]. No ENT referral was placed during the previous visit at the urgent care. Referral placed for ENT.

## 2021-12-09 DIAGNOSIS — J351 Hypertrophy of tonsils: Secondary | ICD-10-CM | POA: Diagnosis not present

## 2021-12-16 NOTE — Discharge Instructions (Signed)
T & A INSTRUCTION SHEET - [facility]  PAUL JUENGEL, MD    INFORMATION SHEET FOR A TONSILLECTOMY AND ADENDOIDECTOMY  About Your Tonsils and Adenoids  The tonsils and adenoids are normal body tissues that are part of our immune system.  They normally help to protect us against diseases that may enter our mouth and nose. However, sometimes the tonsils and/or adenoids become too large and obstruct our breathing, especially at night.    If either of these things happen it helps to remove the tonsils and adenoids in order to become healthier. The operation to remove the tonsils and adenoids is called a tonsillectomy and adenoidectomy.  The Location of Your Tonsils and Adenoids  The tonsils are located in the back of the throat on both side and sit in a cradle of muscles. The adenoids are located in the roof of the mouth, behind the nose, and closely associated with the opening of the Eustachian tube to the ear.  Surgery on Tonsils and Adenoids  A tonsillectomy and adenoidectomy is a short operation which takes about thirty minutes.  This includes being put to sleep and being awakened. Tonsillectomies and adenoidectomies are performed at [facility] and may require observation period in the recovery room prior to going home. Children are required to remain in recovery for at least 45 minutes.   Following the Operation for a Tonsillectomy  A cautery machine is used to control bleeding. Bleeding from a tonsillectomy and adenoidectomy is minimal and postoperatively the risk of bleeding is approximately four percent, although this rarely life threatening.  After your tonsillectomy and adenoidectomy post-op care at home: 1. Our patients are able to go home the same day. You may be given prescriptions for pain medications, if indicated. 2. It is extremely important to remember that fluid intake is of utmost importance after a tonsillectomy. The  amount that you drink must be maintained in the postoperative period. A good indication of whether a child is getting enough fluid is whether his/her urine output is constant. As long as children are urinating or wetting their diaper every 6 - 8 hours this is usually enough fluid intake.   3. Although rare, this is a risk of some bleeding in the first ten days after surgery. This usually occurs between day five and nine postoperatively. This risk of bleeding is approximately four percent. If you or your child should have any bleeding you should remain calm and notify our office or go directly to the emergency room at [facility] where they will contact us. Our doctors are available seven days a week for notification. We recommend sitting up quietly in a chair, place an ice pack on the front of the neck and spitting out the blood gently until we are able to contact you. Adults should gargle gently with ice water and this may help stop the bleeding. If the bleeding does not stop after a short time, i.e. 10 to 15 minutes, or seems to be increasing again, please contact us or go to the hospital.   4. It is common for the pain to be worse at 5 - 7 days postoperatively. This occurs because the "scab" is peeling off and the mucous membrane (skin of the throat) is growing back where the tonsils were.   5. It is common for a low-grade fever, less than 102, during the first week after a tonsillectomy and adenoidectomy. It is usually due to   not drinking enough liquids, and we suggest your use liquid Tylenol (acetaminophen) or the pain medicine with Tylenol (acetaminophen) prescribed in order to keep your temperature below 102. Please follow the directions on the back of the bottle. 6. Recommendations for post-operative pain in children and adults: a) For Children 12 and younger: Recommendations are for oral Tylenol (acetaminophen) and oral Motrin (ibuprofen). Administer the Tylenol (acetaminophen) and  Motrin as stated on bottle for patient's age/weight. Sometimes it may be necessary to alternate the Tylenol (acetaminophen) and Motrin for improved pain control. Motrin (ibuprofen) does last slightly longer so many patients benefit from being given this prior to bedtime. All children should avoid Aspirin products for 2 weeks following surgery. b) For children over the age of 12: Tylenol (acetaminophen) is the preferred first choice for pain control. Depending on your child's size, sometimes they will be given a combination of Tylenol (acetaminophen) and hydrocodone medication or sometimes it will be recommended they take Motrin (ibuprofen) in addition to the Tylenol (acetaminophen). Narcotics should always be used with caution in children following surgery as they can suppress their breathing and switching to over the counter Tylenol (acetaminophen) and Motrin (ibuprofen) as soon as possible is recommended. All patients should avoid Aspirin products for 2 weeks following surgery. c) Adults: Usually adults will require a narcotic pain medication following a tonsillectomy. This usually has either hydrocodone or oxycodone in it and can usually be taken every 4 to 6 hours as needed for moderate pain. If the medication does not have Tylenol (acetaminophen) in it, you may also supplement Tylenol (acetaminophen) as needed every 4 to 6 hours for breakthrough or mild pain. Adults should avoid Aspirin, Aleve, Motrin, and Ibuprofen products for 2 weeks following surgery as they can increase your risk of bleeding. 7. If you happen to look in the mirror or into your child's mouth you will see white/gray patches on the back of the throat. This is what a scab looks like in the mouth and is normal after having a tonsillectomy and adenoidectomy. They will disappear once the tonsil areas heal completely. However, it may cause a noticeable odor, and this too will disappear with time.     8. You or your child may experience ear  pain after having a tonsillectomy and adenoidectomy.  This is called referred pain and comes from the throat, but it is felt in the ears.  Ear pain is quite common and expected. It will usually go away after ten days. There is usually nothing wrong with the ears, and it is primarily due to the healing area stimulating the nerve to the ear that runs along the side of the throat. Use either the prescribed pain medicine or Tylenol (acetaminophen) as needed.  9. The throat tissues after a tonsillectomy are obviously sensitive. Smoking around children who have had a tonsillectomy significantly increases the risk of bleeding. DO NOT SMOKE!  What to Expect Each Day  First Day at Home 1. Patients will be discharged home the same day.  2. Drink at least four glasses of liquid a day. Clear, cool liquids are recommended. Fruit juices containing citric acid are not recommended because they tend to cause pain. Carbonated beverages are allowed if you pour them from glass to glass to remove the bubbles as these tend to cause discomfort. Avoid alcoholic beverages.  3. Eat very soft foods such as soups, broth, jello, custard, pudding, ice cream, popsicles, applesauce, mashed potatoes, and in general anything that you can crush between your   tongue and the roof of your mouth. Try adding Carnation Instant Breakfast Mix into your food for extra calories. It is not uncommon to lose 5 to 10 pounds of fluid weight. The weight will be gained back quickly once you're feeling better and drinking more.  4. Sleep with your head elevated on two pillows for about three days to help decrease the swelling.  5. DO NOT SMOKE!  Day Two  1. Rest as much as possible. Use common sense in your activities.  2. Continue drinking at least four glasses of liquid per day.  3. Follow the soft diet.  4. Use your pain medication as needed.  Day Three  1. Advance your activity as you are able and continue to follow the previous day's suggestions.   Days Four Through Six  1. Advance your diet and begin to eat more solid foods such as chopped hamburger. 2. Advance your activities slowly. Children should be kept mostly around the house.  3. Not uncommonly, there will be more pain at this time. It is temporary, usually lasting a day or two.  Day Seven Through Ten  1. Most individuals by this time are able to return to work or school unless otherwise instructed. Consider sending children back to school for a half day on the first day back. 

## 2021-12-17 ENCOUNTER — Encounter: Payer: Self-pay | Admitting: Otolaryngology

## 2021-12-22 NOTE — Anesthesia Preprocedure Evaluation (Addendum)
Anesthesia Evaluation  Patient identified by MRN, date of birth, ID band Patient awake    Reviewed: Allergy & Precautions, NPO status , Patient's Chart, lab work & pertinent test results  Airway Mallampati: III  TM Distance: >3 FB Neck ROM: full    Dental  (+) Chipped   Pulmonary former smoker   Pulmonary exam normal        Cardiovascular negative cardio ROS Normal cardiovascular exam     Neuro/Psych  PSYCHIATRIC DISORDERS  Depression    negative neurological ROS     GI/Hepatic negative GI ROS, Neg liver ROS,,,  Endo/Other  negative endocrine ROS    Renal/GU      Musculoskeletal   Abdominal   Peds  Hematology negative hematology ROS (+)   Anesthesia Other Findings Past Medical History: No date: Alcoholic gastritis with bleeding  Past Surgical History: No date: DENTAL RESTORATION/EXTRACTION WITH X-RAY  BMI    Body Mass Index: 24.69 kg/m      Reproductive/Obstetrics negative OB ROS                             Anesthesia Physical Anesthesia Plan  ASA: 2  Anesthesia Plan: General ETT   Post-op Pain Management: Tylenol PO (pre-op) and Toradol IV (intra-op)   Induction: Intravenous  PONV Risk Score and Plan: 4 or greater and Ondansetron, Dexamethasone, Midazolam and Droperidol  Airway Management Planned: Oral ETT  Additional Equipment:   Intra-op Plan:   Post-operative Plan:   Informed Consent:      Dental Advisory Given  Plan Discussed with: Anesthesiologist, CRNA and Surgeon  Anesthesia Plan Comments:         Anesthesia Quick Evaluation

## 2021-12-23 ENCOUNTER — Encounter: Payer: Self-pay | Admitting: Otolaryngology

## 2021-12-23 ENCOUNTER — Ambulatory Visit
Admission: RE | Admit: 2021-12-23 | Discharge: 2021-12-23 | Disposition: A | Discharge status: Home / Self Care | Payer: BC Managed Care – PPO | Attending: Otolaryngology | Admitting: Otolaryngology

## 2021-12-23 ENCOUNTER — Other Ambulatory Visit: Payer: Self-pay

## 2021-12-23 ENCOUNTER — Ambulatory Visit: Payer: BC Managed Care – PPO | Admitting: Anesthesiology

## 2021-12-23 ENCOUNTER — Encounter
Admission: RE | Disposition: A | Discharge status: Home / Self Care | Payer: Self-pay | Source: Home / Self Care | Attending: Otolaryngology

## 2021-12-23 DIAGNOSIS — Z87891 Personal history of nicotine dependence: Secondary | ICD-10-CM | POA: Diagnosis not present

## 2021-12-23 DIAGNOSIS — K2921 Alcoholic gastritis with bleeding: Secondary | ICD-10-CM | POA: Insufficient documentation

## 2021-12-23 DIAGNOSIS — J3501 Chronic tonsillitis: Secondary | ICD-10-CM | POA: Insufficient documentation

## 2021-12-23 DIAGNOSIS — J351 Hypertrophy of tonsils: Secondary | ICD-10-CM | POA: Diagnosis not present

## 2021-12-23 HISTORY — PX: TONSILLECTOMY: SHX5217

## 2021-12-23 SURGERY — TONSILLECTOMY
Anesthesia: General | Site: Throat | Laterality: Bilateral

## 2021-12-23 MED ORDER — OXYCODONE HCL 5 MG/5ML PO SOLN
5.0000 mg | Freq: Once | ORAL | Status: AC | PRN
  Administered 2021-12-23: 5 mg via ORAL

## 2021-12-23 MED ORDER — ACETAMINOPHEN 500 MG PO TABS
1000.0000 mg | ORAL_TABLET | Freq: Once | ORAL | Status: AC
  Administered 2021-12-23: 1000 mg via ORAL

## 2021-12-23 MED ORDER — DEXAMETHASONE SODIUM PHOSPHATE 4 MG/ML IJ SOLN
INTRAMUSCULAR | Status: DC | PRN
  Administered 2021-12-23: 8 mg via INTRAVENOUS

## 2021-12-23 MED ORDER — PROPOFOL 10 MG/ML IV BOLUS
INTRAVENOUS | Status: DC | PRN
  Administered 2021-12-23: 200 mg via INTRAVENOUS

## 2021-12-23 MED ORDER — HYDROCODONE-ACETAMINOPHEN 7.5-325 MG/15ML PO SOLN: 15.0000 mL | ORAL | 0 refills | Status: AC | PRN

## 2021-12-23 MED ORDER — ACETAMINOPHEN 10 MG/ML IV SOLN
INTRAVENOUS | Status: DC | PRN
  Administered 2021-12-23: 1000 mg via INTRAVENOUS

## 2021-12-23 MED ORDER — ACETAMINOPHEN 10 MG/ML IV SOLN: 1000.0000 mg | Freq: Once | INTRAVENOUS | Status: DC | PRN

## 2021-12-23 MED ORDER — PROMETHAZINE HCL 25 MG/ML IJ SOLN: 6.2500 mg | INTRAMUSCULAR | Status: DC | PRN

## 2021-12-23 MED ORDER — FENTANYL CITRATE PF 50 MCG/ML IJ SOSY: 25.0000 ug | PREFILLED_SYRINGE | INTRAMUSCULAR | Status: DC | PRN

## 2021-12-23 MED ORDER — ONDANSETRON HCL 4 MG/2ML IJ SOLN
INTRAMUSCULAR | Status: DC | PRN
  Administered 2021-12-23: 4 mg via INTRAVENOUS

## 2021-12-23 MED ORDER — OXYCODONE HCL 5 MG PO TABS: 5.0000 mg | ORAL_TABLET | Freq: Once | ORAL | Status: AC | PRN

## 2021-12-23 MED ORDER — DEXMEDETOMIDINE HCL IN NACL 200 MCG/50ML IV SOLN
INTRAVENOUS | Status: DC | PRN
  Administered 2021-12-23: 8 ug via INTRAVENOUS
  Administered 2021-12-23: 4 ug via INTRAVENOUS
  Administered 2021-12-23: 8 ug via INTRAVENOUS

## 2021-12-23 MED ORDER — DROPERIDOL 2.5 MG/ML IJ SOLN: 0.6250 mg | Freq: Once | INTRAMUSCULAR | Status: DC | PRN

## 2021-12-23 MED ORDER — SUGAMMADEX SODIUM 200 MG/2ML IV SOLN
INTRAVENOUS | Status: DC | PRN
  Administered 2021-12-23: 200 mg via INTRAVENOUS

## 2021-12-23 MED ORDER — MIDAZOLAM HCL 5 MG/5ML IJ SOLN
INTRAMUSCULAR | Status: DC | PRN
  Administered 2021-12-23 (×2): 1 mg via INTRAVENOUS

## 2021-12-23 MED ORDER — FENTANYL CITRATE (PF) 100 MCG/2ML IJ SOLN
INTRAMUSCULAR | Status: DC | PRN
  Administered 2021-12-23 (×2): 50 ug via INTRAVENOUS

## 2021-12-23 MED ORDER — LIDOCAINE HCL (CARDIAC) PF 100 MG/5ML IV SOSY
PREFILLED_SYRINGE | INTRAVENOUS | Status: DC | PRN
  Administered 2021-12-23: 100 mg via INTRAVENOUS

## 2021-12-23 MED ORDER — ROCURONIUM BROMIDE 100 MG/10ML IV SOLN
INTRAVENOUS | Status: DC | PRN
  Administered 2021-12-23: 40 mg via INTRAVENOUS

## 2021-12-23 MED ORDER — LACTATED RINGERS IV SOLN: INTRAVENOUS | Status: DC

## 2021-12-23 SURGICAL SUPPLY — 13 items
BLADE ELECT COATED/INSUL 125 (ELECTRODE) ×1 IMPLANT
CANISTER SUCT 1200ML W/VALVE (MISCELLANEOUS) ×1 IMPLANT
ELECT REM PT RETURN 9FT ADLT (ELECTROSURGICAL) ×1
ELECTRODE REM PT RTRN 9FT ADLT (ELECTROSURGICAL) ×1 IMPLANT
GLOVE SURG GAMMEX PI TX LF 7.5 (GLOVE) ×1 IMPLANT
KIT TURNOVER KIT A (KITS) ×1 IMPLANT
PACK TONSIL AND ADENOID CUSTOM (PACKS) ×1 IMPLANT
PENCIL SMOKE EVACUATOR (MISCELLANEOUS) ×1 IMPLANT
SLEEVE SUCTION 125 (MISCELLANEOUS) ×1 IMPLANT
SOL ANTI-FOG 6CC FOG-OUT (MISCELLANEOUS) ×1 IMPLANT
STRAP BODY AND KNEE 60X3 (MISCELLANEOUS) ×1 IMPLANT
SUT VIC AB 3-0 SH 27 (SUTURE) ×1
SUT VIC AB 3-0 SH 27X BRD (SUTURE) IMPLANT

## 2021-12-23 NOTE — Anesthesia Procedure Notes (Signed)
Procedure Name: Intubation Date/Time: 12/23/2021 10:55 AM  Performed by: Tobie Poet, CRNAPre-anesthesia Checklist: Patient identified, Emergency Drugs available, Suction available and Patient being monitored Patient Re-evaluated:Patient Re-evaluated prior to induction Oxygen Delivery Method: Circle system utilized Preoxygenation: Pre-oxygenation with 100% oxygen Induction Type: IV induction Ventilation: Mask ventilation without difficulty Laryngoscope Size: Mac and 4 Grade View: Grade I Tube type: Oral Tube size: 7.0 mm Number of attempts: 1 Airway Equipment and Method: Stylet and Oral airway Placement Confirmation: ETT inserted through vocal cords under direct vision, positive ETCO2 and breath sounds checked- equal and bilateral Secured at: 22 cm Tube secured with: Tape Dental Injury: Teeth and Oropharynx as per pre-operative assessment

## 2021-12-23 NOTE — Op Note (Signed)
12/23/2021  11:34 AM    Sol Passer  [id number]   Pre-Op Dx: Chronic tonsillitis with massive tonsils  Post-op Dx: Chronic tonsillitis with massive tonsils and previous peritonsillar abscesses on both sides  Proc: Tonsillectomy with uvulopalatoplasty  Surg:  Beverly Sessions Justin Buechner  Anes:  GOT  EBL: 50 mL  Comp: None  Findings: Massive tonsils touching in the midline.  Uvula was extremely long.  The right tonsil was extremely scarred down and there was evidence of some gray-yellow mucus that was in the peritonsillar area from a resolving abscess.  There is scar tissue in the base of the left tonsil as well which represented previous abscess.  Procedure: Patient was brought to the operating room placed in supine position.  He was given general anesthesia by oral endotracheal intubation.  Once the patient was asleep a Dingman mouthgag was used to visualize the oropharynx.  A #4 blade was used to help hold the tongue and oral endotracheal tube.  Once this was secured in place she could see the tonsils were extremely large and touching in the midline.  The uvula was very long.  The nasopharynx was clear that I could see behind the soft palate.  The left tonsil was grasped first and pulled medially.  Anterior pillar was incised.  The tonsil was dissected from its fossa using blunt dissection with electrocautery.  As a get down towards the base of the tonsil it was completely scarred down where the previous abscess had been.  I was able to remove the entire tonsil.  Bleeding was controlled with direct pressure and electrocautery near the tongue base and in the fossa.  Once the left side was completely removed and bleeding controlled the right side was then removed in a similar fashion.  The tonsil was grasped and pulled medially and the anterior pillar incised.  In the midportion of the tonsil I found very thick scar tissue and as I broke into this to separate the tonsil from the underlying muscle there  was a pocket of fluid of yellow clear gray drainage.  This is a resolving abscess.  The remaining tonsil was freed up from the tongue base and bleeding was controlled with electrocautery.  This left a very large tonsillar fossa's on both sides with some redundant lymph tissue on the posterior pharyngeal wall and posterior tonsillar pillars.  The uvula was very long and the palate was sitting on the posterior pharyngeal wall.  A small incision was made along the uvula on each side and the posterior tonsillar pillar was lateralized to the anterior tonsillar pillar.  A 3-0 Vicryl suture was used in a mattress fashion on both sides to pull the posterior tonsillar pillar forward and advance that to close the upper portion of the tonsillar fossa on both sides.  This left better opening into the nasopharynx but the uvula was extremely long.  Approximately 1 cm of uvula was trimmed to leave a full centimeter plus of the uvula left sitting in the midline.  This was trimmed with electrocautery and there is no bleeding of the uvular arteries.  His tonsillar fossa's were dry and there is significantly more room in his oropharynx and into his nasopharynx.  The patient tolerated the procedure well.  He was awakened taken to the recovery room in satisfactory condition.  There were no operative complications  Dispo:   To PACU to be discharged home  Plan: To follow-up in the office in 3 weeks make sure he is doing well.  He will use liquid Tylenol with hydrocodone for pain and supplement this with Tylenol as needed.  He will make sure he stays well-hydrated through this entire period.  He can slowly start taking soft foods as tolerated  Cammy Copa  12/23/2021 11:34 AM

## 2021-12-23 NOTE — Transfer of Care (Signed)
Immediate Anesthesia Transfer of Care Note  Patient: Keith Dudley  Procedure(s) Performed: TONSILLECTOMY (Bilateral: Throat)  Patient Location: PACU  Anesthesia Type: General ETT  Level of Consciousness: awake, alert  and patient cooperative  Airway and Oxygen Therapy: Patient Spontanous Breathing and Patient connected to supplemental oxygen  Post-op Assessment: Post-op Vital signs reviewed, Patient's Cardiovascular Status Stable, Respiratory Function Stable, Patent Airway and No signs of Nausea or vomiting  Post-op Vital Signs: Reviewed and stable  Complications: No notable events documented.

## 2021-12-23 NOTE — H&P (Signed)
H&P has been reviewed and patient reevaluated, no changes necessary. To be downloaded later.  

## 2021-12-23 NOTE — Anesthesia Postprocedure Evaluation (Signed)
Anesthesia Post Note  Patient: Keith Dudley  Procedure(s) Performed: TONSILLECTOMY (Bilateral: Throat)  Patient location during evaluation: PACU Anesthesia Type: General Level of consciousness: awake and alert Pain management: pain level controlled Vital Signs Assessment: post-procedure vital signs reviewed and stable Respiratory status: spontaneous breathing, nonlabored ventilation and respiratory function stable Cardiovascular status: blood pressure returned to baseline and stable Postop Assessment: no apparent nausea or vomiting Anesthetic complications: no   No notable events documented.   Last Vitals:  Vitals:   12/23/21 1225 12/23/21 1230  BP:    Pulse: 70 73  Resp: 10 10  Temp:    SpO2: 99% 100%    Last Pain:  Vitals:   12/23/21 1230  TempSrc:   PainSc: 5                  Foye Deer

## 2021-12-24 ENCOUNTER — Encounter: Payer: Self-pay | Admitting: Otolaryngology

## 2021-12-26 ENCOUNTER — Other Ambulatory Visit: Payer: Self-pay

## 2021-12-26 ENCOUNTER — Emergency Department
Admission: EM | Admit: 2021-12-26 | Discharge: 2021-12-26 | Disposition: A | Discharge status: Home / Self Care | Payer: BC Managed Care – PPO | Attending: Emergency Medicine | Admitting: Emergency Medicine

## 2021-12-26 ENCOUNTER — Emergency Department: Payer: BC Managed Care – PPO

## 2021-12-26 ENCOUNTER — Ambulatory Visit
Admission: EM | Admit: 2021-12-26 | Discharge: 2021-12-26 | Disposition: A | Discharge status: Other Acute Inpatient Hospital | Payer: BC Managed Care – PPO | Attending: Family Medicine | Admitting: Family Medicine

## 2021-12-26 DIAGNOSIS — J95830 Postprocedural hemorrhage and hematoma of a respiratory system organ or structure following a respiratory system procedure: Secondary | ICD-10-CM | POA: Diagnosis not present

## 2021-12-26 DIAGNOSIS — J029 Acute pharyngitis, unspecified: Secondary | ICD-10-CM | POA: Diagnosis not present

## 2021-12-26 DIAGNOSIS — G8918 Other acute postprocedural pain: Secondary | ICD-10-CM | POA: Diagnosis not present

## 2021-12-26 DIAGNOSIS — J9589 Other postprocedural complications and disorders of respiratory system, not elsewhere classified: Secondary | ICD-10-CM | POA: Insufficient documentation

## 2021-12-26 DIAGNOSIS — R070 Pain in throat: Secondary | ICD-10-CM | POA: Diagnosis not present

## 2021-12-26 DIAGNOSIS — L089 Local infection of the skin and subcutaneous tissue, unspecified: Secondary | ICD-10-CM | POA: Diagnosis not present

## 2021-12-26 LAB — CBC
HCT: 46.4 % (ref 39.0–52.0)
Hemoglobin: 16.1 g/dL (ref 13.0–17.0)
MCH: 29.8 pg (ref 26.0–34.0)
MCHC: 34.7 g/dL (ref 30.0–36.0)
MCV: 85.8 fL (ref 80.0–100.0)
Platelets: 206 10*3/uL (ref 150–400)
RBC: 5.41 MIL/uL (ref 4.22–5.81)
RDW: 13.5 % (ref 11.5–15.5)
WBC: 6 10*3/uL (ref 4.0–10.5)
nRBC: 0 % (ref 0.0–0.2)

## 2021-12-26 LAB — COMPREHENSIVE METABOLIC PANEL
ALT: 15 U/L (ref 0–44)
AST: 18 U/L (ref 15–41)
Albumin: 4.6 g/dL (ref 3.5–5.0)
Alkaline Phosphatase: 59 U/L (ref 38–126)
Anion gap: 10 (ref 5–15)
BUN: 17 mg/dL (ref 6–20)
CO2: 24 mmol/L (ref 22–32)
Calcium: 9.5 mg/dL (ref 8.9–10.3)
Chloride: 103 mmol/L (ref 98–111)
Creatinine, Ser: 1.04 mg/dL (ref 0.61–1.24)
GFR, Estimated: >60 mL/min (ref >60)
Glucose, Bld: 99 mg/dL (ref 70–99)
Potassium: 3.6 mmol/L (ref 3.5–5.1)
Sodium: 137 mmol/L (ref 135–145)
Total Bilirubin: 1.1 mg/dL (ref 0.3–1.2)
Total Protein: 8.8 g/dL — ABNORMAL HIGH (ref 6.5–8.1)

## 2021-12-26 MED ORDER — IOHEXOL 300 MG/ML  SOLN
75.0000 mL | Freq: Once | INTRAMUSCULAR | Status: AC | PRN
  Administered 2021-12-26: 75 mL via INTRAVENOUS

## 2021-12-26 MED ORDER — LIDOCAINE VISCOUS HCL 2 % MT SOLN: 10.0000 mL | OROMUCOSAL | 0 refills | Status: DC | PRN

## 2021-12-26 MED ORDER — DEXAMETHASONE SODIUM PHOSPHATE 10 MG/ML IJ SOLN
10.0000 mg | Freq: Once | INTRAMUSCULAR | Status: AC
  Administered 2021-12-26: 10 mg via INTRAVENOUS
  Filled 2021-12-26: qty 1

## 2021-12-26 MED ORDER — MORPHINE SULFATE (PF) 4 MG/ML IV SOLN
4.0000 mg | Freq: Once | INTRAVENOUS | Status: AC
  Administered 2021-12-26: 4 mg via INTRAVENOUS
  Filled 2021-12-26: qty 1

## 2021-12-26 MED ORDER — METHYLPREDNISOLONE 4 MG PO TBPK: ORAL_TABLET | ORAL | 0 refills | Status: DC

## 2021-12-26 MED ORDER — KETOROLAC TROMETHAMINE 30 MG/ML IJ SOLN
30.0000 mg | Freq: Once | INTRAMUSCULAR | Status: AC
  Administered 2021-12-26: 30 mg via INTRAVENOUS
  Filled 2021-12-26: qty 1

## 2021-12-26 NOTE — ED Triage Notes (Signed)
Pt to the ED for throat and ear pain after tonsillectomy on 11/16. Has been taking hydrocodone.  RR even and unlabored, NAD noted, speaking in clear sentences.

## 2021-12-26 NOTE — ED Provider Notes (Signed)
[facility]    CSN: [id number] Arrival date & time: 12/26/21  1003      History   Chief Complaint Chief Complaint  Patient presents with   Sore Throat    HPI Ledell Mooneyhan is a 26 y.o. male.   HPI   Chaddrick presents with his mom for ongoing sore throat.  3 days ago he had his tonsils removed.  States that he is getting more and more pain and the pain medication is not helping.  He has been taking the pain medication as prescribed.  He called a hotline who told him not to take any medication be seen by healthcare provider.  He came to the urgent care for evaluation.  Mom states they called his surgeon but no one called him back.  They have not been able to successfully get in touch with anyone.   He is been able to swallow Jell-O and applesauce with some pain.  He feels like the pain is radiating upward.  Says that they had to do a special procedure during his surgery regarding his airway.  He denies any current difficulty breathing or recent fever.      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  Authorizing Provider  HYDROcodone-acetaminophen (HYCET) 7.5-325 mg/15 ml solution Take 15 mLs by mouth every 4 (four) hours as needed for up to 5 days for moderate pain (Do not take more than 5 doses in 24 hours.). 12/23/21 12/28/21  Margaretha Sheffield, MD  mirtazapine (REMERON) 15 MG tablet Take 1 tablet (15 mg total) by mouth at bedtime. 08/21/17 06/18/20  Pucilowska, Wardell Honour, MD  pantoprazole (PROTONIX) 40 MG tablet Take 1 tablet (40 mg total) by mouth 2 (two) times daily. 08/21/17 09/12/19  Pucilowska, Wardell Honour, MD  traZODone (DESYREL) 100 MG tablet Take 1 tablet (100 mg total) by mouth at bedtime as needed for sleep. 08/21/17 06/18/20  PucilowskaWardell Honour, MD    Family History Family History  Problem Relation Age of Onset   Healthy Mother    Healthy Father     Social History Social History   Tobacco Use   Smoking status: Former    Types: Cigarettes   Smokeless tobacco: Never  Vaping Use   Vaping Use: Never used  Substance Use Topics   Alcohol use: Yes    Comment: social   Drug use: Yes    Types: Marijuana    Comment: occasionaly     Allergies   Patient has no known allergies.   Review of Systems Review of Systems: negative unless otherwise stated in HPI.      Physical Exam Triage Vital Signs ED  Triage Vitals [12/26/21 1143]  Enc Vitals Group     BP (!) 138/98     Pulse Rate 80     Resp      Temp 98.5 F (36.9 C)     Temp Source Oral     SpO2 100 %     Weight 170 lb (77.1 kg)     Height 5\' 11"  (1.803 m)     Head Circumference      Peak Flow      Pain Score 10     Pain Loc      Pain Edu?      Excl. in GC?    No data found.  Updated Vital Signs BP (!) 138/98 (BP Location: Left Arm)   Pulse 80   Temp 98.5 F (36.9 C) (Oral)   Ht 5\' 11"  (1.803 m)   Wt 77.1 kg   SpO2 100%   BMI 23.71 kg/m   Visual Acuity Right Eye Distance:   Left Eye Distance:   Bilateral Distance:    Right Eye Near:   Left Eye Near:    Bilateral Near:     Physical  Exam GEN:     alert, uncomfortable appearing male noting the sides of his face   HENT:  mucus membranes moist, posterior oropharynx with moderately severe erythema and granulation tissue from recent surgery, no tonsils seen EYES:   pupils equal and reactive NECK:  normal ROM   RESP:  no increased work of breathing, clear to auscultation bilaterally CVS:   regular rate and rhythm Skin:   warm and dry    UC Treatments / Results  Labs (all labs ordered are listed, but only abnormal results are displayed) Labs Reviewed - No data to display  EKG   Radiology No results found.  Procedures Procedures (including critical care time)  Medications Ordered in UC Medications - No data to display  Initial Impression / Assessment and Plan / UC Course  I have reviewed the triage vital signs and the nursing notes.  Pertinent labs & imaging results that were available during my care of the patient were reviewed by me and considered in my medical decision making (see chart for details).       Pt is a 26 y.o. male who presents after 3 days his tonsillectomy by Dr. , ENT.   He has been taking his hydrocodone syrup but has not this is not providing him any relief.  He appears uncomfortable here and is holding his face.  I called [facility] in [place] but there was no provider on call.  Patient likely needs a CT of his neck to rule out abscess and additional pain control.  Unfortunately, this cannot be provided here.  Advised him to be seen in the emergency department he is agreeable.  Discussed MDM, treatment plan and plan for follow-up with patient/parent who agrees with plan.     Final Clinical Impressions(s) / UC Diagnoses   Final diagnoses:  Throat pain in adult     Discharge Instructions      You have been advised to follow up immediately in the emergency department for concerning signs or symptoms as discussed during your visit. If you declined EMS  transport, please have a family member take you directly to the ED at this time. Do not delay.   Based on concerns about condition, if you do not follow up in the ED, you may risk poor outcomes including worsening of  condition, delayed treatment and potentially life threatening issues. If you have declined to go to the ED at this time, you should call your PCP immediately to set up a follow up appointment.   Go to ED for red flag symptoms, including; fevers you cannot reduce with Tylenol/Motrin, severe headaches, vision changes, numbness/weakness in part of the body, lethargy, confusion, intractable vomiting, severe dehydration, chest pain, breathing difficulty, severe persistent abdominal or pelvic pain, signs of severe infection (increased redness, swelling of an area), feeling faint or passing out, dizziness, etc. You should especially go to the ED for sudden acute worsening of condition if you do not elect to go at this time.       ED Prescriptions   None    PDMP not reviewed this encounter.   Lyndee Hensen, DO 12/26/21 1254

## 2021-12-26 NOTE — ED Provider Notes (Signed)
-----------------------------------------   9:11 PM on 12/26/2021 -----------------------------------------  Blood pressure 125/72, pulse 76, temperature 98.4 F (36.9 C), temperature source Oral, resp. rate 16, SpO2 98 %.  Assuming care from Dr. Fanny Bien.  In short, Keith Dudley is a 26 y.o. male with a chief complaint of Sore .  Refer to the original H&P for additional details.  The current plan of care is to await CT scan.  Patient presents emergency department sharp sore throat after having a tonsillectomy and uvular surgery.  Patient was currently awaiting CT to ensure no evidence of large hematoma affecting the airway or evidence of infection.  CT has now returned with reassuring results.  Patient is managing secretions well.  After medications here he is feeling much improved.  At this time we will prescribe Medrol Dosepak for the patient as well as viscous lidocaine.  Follow-up with ENT in the next 24 to 48 hours.  ENT is aware of the patient..   ED diagnosis:  Post tonsillectomy pain   Alm Bustard Delorise Royals, PA-C 12/26/21 2114    Sharyn Creamer, MD 12/27/21 [phone]

## 2021-12-26 NOTE — ED Provider Notes (Signed)
[facility] Provider Note    Event Date/Time   First MD Initiated Contact with Patient 12/26/21 1444     (approximate)   History   Sore   HPI  Keith Dudley is a 26 y.o. male who has a history of recent tonsillectomy as a uvular surgery.  Reviewed notes, patient had surgery with Dr. Elenore Rota  Patient reports for the first 2 days or so he felt fine.  Now today and yesterday started having quite a bit of pain in his throat.  It seems to radiate pain up towards his ears and his upper neck.  He still able to eat and drink, his voice is just slightly hoarse, and he reports no trouble breathing at all.  He has seen no bleeding.  No fevers or chills.  Reports the pain is quite severe and has been taking liquid hydrocodone but its not really helping much.  He tried to reach his otolaryngologist office without success from urgent care today.  He was referred from urgent care for further evaluation     Physical Exam   Triage Vital Signs: ED Triage Vitals  Enc Vitals Group     BP 12/26/21 1353 (!) 142/91     Pulse Rate 12/26/21 1353 77     Resp 12/26/21 1353 16     Temp 12/26/21 1353 99.2 F (37.3 C)     Temp src --      SpO2 12/26/21 1353 99 %     Weight --      Height --      Head Circumference --      Peak Flow --      Pain Score 12/26/21 1355 10     Pain Loc --      Pain Edu? --      Excl. in GC? --     Most recent vital signs: Vitals:   12/26/21 1353 12/26/21 1644  BP: (!) 142/91 125/72  Pulse: 77 76  Resp: 16 16  Temp: 99.2 F (37.3 C) 98.4 F (36.9 C)  SpO2: 99% 98%     General: Awake, no distress.  He is very pleasant.  His mother also at the bedside. Please see clinical media upload. 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  The oropharynx is widely patent however and there is no noted unilateral edema, no difficulty swallowing, handling [place] secretions well, no stridor.  Normal work of breathing. CV:  Good peripheral perfusion.  Normal heart tones Resp:  Normal effort.  Clear bilateral Abd:  No distention.  Other:  He is alert well-oriented.  He does not appear to be in any extremis without any difficulty breathing or swallowing.   ED Results / Procedures / Treatments   Labs (all labs ordered are listed, but only abnormal results are displayed) Labs Reviewed  COMPREHENSIVE METABOLIC PANEL - Abnormal; Notable for the following components:      Result Value   Total Protein 8.8 (*)    All other components within normal limits  CBC     EKG     RADIOLOGY  CT of the soft tissue of the neck pending at time of signout   PROCEDURES:  Critical Care performed: No  Procedures   MEDICATIONS ORDERED IN ED:  Medications  ketorolac (TORADOL) 30 MG/ML injection 30 mg (30 mg Intravenous Given 12/26/21 1639)  morphine (PF) 4 MG/ML injection 4 mg (4 mg Intravenous Given 12/26/21 1644)  dexamethasone (DECADRON) injection 10 mg (10 mg Intravenous Given 12/26/21 1641)     IMPRESSION / MDM / ASSESSMENT AND PLAN / ED COURSE  I reviewed the triage vital signs and the nursing notes.                              Differential diagnosis includes, but is not limited to, post tonsillectomy pain, post tonsillectomy edema, abscess, or complication.  No fever I highly doubt he has acute pharyngitis at this point, it appears as symptoms and pain as well as exam are consistent with post tonsillectomy.  I discussed with our ear nose and throat doctor Dr. Elenore Rota, and discussed clinical examination clinical history etc. and he advises that this is actually quite expected in the patient's course.  He anticipates the patient's dose of steroid from surgery is likely starting to diminish, he recommends that it  is reasonable to redose Decadron, and consider placing patient on a Medrol Dosepak which she could start an additional 2 days.  Dr. Elenore Rota advises follow-up with this clinic, and he would be happy to refill the patient's hydrocodone prescription if needed as well.  At this juncture and by exam I do not see evidence of an acute complication related to his surgery he seems to be doing well nontoxic-appearing handling secretions and airway well but pain seems to be the primary concern.  Patient's presentation is most consistent with acute complicated illness / injury requiring diagnostic workup.  ----------------------------------------- 6:55 PM on 12/26/2021 ----------------------------------------- Patient waiting on CT scan currently drinking water with ice, reports he is feeling a lot better.  He appears much better reports his pain is essentially gone.  I discussed with him that at this point given how well he feels, we could consider discharging him without CT and take careful return precautions if worsening, but patient advises he would like to have further evaluation make sure there are no evidence of complications on imaging.  This is also reasonable approach, and with shared medical decision making we will await CT scan.  If his CT does not show any concerning findings, would anticipate discharge and he understands to begin a Medrol Dosepak which Dr. Elenore Rota recommended on roughly Wednesday of this week.  He will be calling tomorrow to set up follow-up and understands that [facility] should also be engaged if he were to need additional hydrocodone.   Ongoing care assigned to Gala Romney at 7:50 PM.  Follow-up on results of CT neck imaging, if this is normal or shows only expected post tonsillectomy findings then I think patient appropriate for discharge to follow-up with Dr. Elenore Rota Monday or Tuesday (Dr. Elenore Rota already aware of patient presentation).  FINAL CLINICAL IMPRESSION(S) / ED  DIAGNOSES   Final diagnoses:  Post-tonsillectomy pain     Rx / DC Orders   ED Discharge Orders          Ordered    methylPREDNISolone (MEDROL DOSEPAK) 4 MG TBPK tablet        12/26/21 1855             Note:  This document was prepared using Dragon voice recognition software and may include unintentional dictation errors.   Sharyn Creamer, MD 12/26/21 2016

## 2021-12-26 NOTE — Discharge Instructions (Addendum)

## 2021-12-26 NOTE — ED Notes (Signed)
Patient is being discharged from the Urgent Care and sent to the Emergency Department via POV. Per Rachael Darby, MD, patient is in need of higher level of care due to enlarged tonsils. Patient is aware and verbalizes understanding of plan of care.  Vitals:   12/26/21 1143  BP: (!) 138/98  Pulse: 80  Temp: 98.5 F (36.9 C)  SpO2: 100%

## 2021-12-26 NOTE — ED Triage Notes (Signed)
Patient presents to [facility] for a sore throat. Patient had his tonsils removed 12/23/2021.   Patient reports that the medication he was given is not working for pain.   Patient reports that pain is from his throat that shoots up to his ears.   They also had to open the airway from his throat to his nose the same day has his tonsillectomy.

## 2021-12-27 LAB — SURGICAL PATHOLOGY

## 2022-10-16 ENCOUNTER — Ambulatory Visit
Admission: EM | Admit: 2022-10-16 | Discharge: 2022-10-16 | Disposition: A | Discharge status: Home / Self Care | Payer: BC Managed Care – PPO | Attending: Family Medicine | Admitting: Family Medicine

## 2022-10-16 ENCOUNTER — Encounter: Payer: Self-pay | Admitting: Emergency Medicine

## 2022-10-16 DIAGNOSIS — U071 COVID-19: Secondary | ICD-10-CM | POA: Diagnosis not present

## 2022-10-16 LAB — SARS CORONAVIRUS 2 BY RT PCR: SARS Coronavirus 2 by RT PCR: POSITIVE — AB

## 2022-10-16 MED ORDER — IPRATROPIUM BROMIDE 0.06 % NA SOLN: 2.0000 | Freq: Four times a day (QID) | NASAL | 12 refills | Status: DC

## 2022-10-16 NOTE — ED Triage Notes (Signed)
Patient c/o cough, sinus pressure and congestion, headache, and bodyaches that started yesterday.

## 2022-10-16 NOTE — Discharge Instructions (Signed)

## 2022-10-22 NOTE — ED Provider Notes (Signed)
[facility]    CSN: [id number] Arrival date & time: 10/16/22  1353      History   Chief Complaint Chief Complaint  Patient presents with   Cough    HPI Keith Dudley is a 27 y.o. male.   HPI  History obtained from the patient. Keith Dudley presents for cough, nasal congestion, sinus pressure, headache, body aches, chills that started yesterday.  He has been taking over-the-counter medications without relief.  No known fever or sick contacts.  He believes he may have a sinus infection.     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 Authorizing Provider  ipratropium (ATROVENT) 0.06 % nasal spray Place 2 sprays into both nostrils 4 (four) times daily. 10/16/22  Yes Darrnell Mangiaracina, DO  lidocaine (XYLOCAINE) 2 % solution Use as directed 10 mLs in the mouth or throat every 4 (four) hours as needed for mouth pain. Gargle and spit 12/26/21   Cuthriell, Delorise Royals, PA-C  methylPREDNISolone (MEDROL DOSEPAK) 4 MG TBPK tablet 1 medrol dose pack, utilize as instructed on package. Start on 12/29/2021 12/26/21   Sharyn Creamer,  MD  mirtazapine (REMERON) 15 MG tablet Take 1 tablet (15 mg total) by mouth at bedtime. 08/21/17 06/18/20  Pucilowska, Ellin Goodie, MD  pantoprazole (PROTONIX) 40 MG tablet Take 1 tablet (40 mg total) by mouth 2 (two) times daily. 08/21/17 09/12/19  Pucilowska, Ellin Goodie, MD  traZODone (DESYREL) 100 MG tablet Take 1 tablet (100 mg total) by mouth at bedtime as needed for sleep. 08/21/17 06/18/20  PucilowskaEllin Goodie, MD    Family History Family History  Problem Relation Age of Onset   Healthy Mother    Healthy Father     Social History Social History   Tobacco Use   Smoking status: Former    Types: Cigarettes   Smokeless tobacco: Never  Vaping Use   Vaping status: Never Used  Substance Use Topics   Alcohol use: Yes    Comment: social   Drug use: Yes    Types: Marijuana    Comment: occasionaly     Allergies   Patient has no known allergies.   Review of Systems Review of Systems: negative unless otherwise stated in HPI.      Physical Exam Triage Vital Signs ED Triage Vitals  Encounter Vitals Group     BP 10/16/22 1417 122/76     Systolic BP Percentile --      Diastolic BP Percentile --      Pulse Rate 10/16/22 1417 81     Resp 10/16/22 1417 15     Temp 10/16/22 1417 98.6 F (37 C)  Temp Source 10/16/22 1417 Oral     SpO2 10/16/22 1417 100 %     Weight 10/16/22 1416 169 lb 15.6 oz (77.1 kg)     Height 10/16/22 1416 5\' 11"  (1.803 m)     Head Circumference --      Peak Flow --      Pain Score 10/16/22 1416 8     Pain Loc --      Pain Education --      Exclude from Growth Chart --    No data found.  Updated Vital Signs BP 122/76 (BP Location: Left Arm)   Pulse 81   Temp 98.6 F (37 C) (Oral)   Resp 15   Ht 5\' 11"  (1.803 m)   Wt 77.1 kg   SpO2 100%   BMI 23.71 kg/m   Visual Acuity Right Eye Distance:   Left Eye Distance:   Bilateral Distance:    Right Eye Near:   Left Eye Near:    Bilateral Near:     Physical Exam GEN:     alert, ill but  non-toxic appearing male in no distress    HENT:  mucus membranes moist, oropharyngeal without lesions or erythema, no tonsils visible, moderate erythematous edematous turbinates, clear nasal discharge EYES:   pupils equal and reactive, no scleral injection or discharge NECK:  normal ROM, no lymphadenopathy, no meningismus   RESP:  no increased work of breathing, clear to auscultation bilaterally CVS:   regular rate and rhythm Skin:   warm and dry, no rash on visible skin    UC Treatments / Results  Labs (all labs ordered are listed, but only abnormal results are displayed) Labs Reviewed  SARS CORONAVIRUS 2 BY RT PCR - Abnormal; Notable for the following components:      Result Value   SARS Coronavirus 2 by RT PCR POSITIVE (*)    All other components within normal limits    EKG   Radiology No results found.  Procedures Procedures (including critical care time)  Medications Ordered in UC Medications - No data to display  Initial Impression / Assessment and Plan / UC Course  I have reviewed the triage vital signs and the nursing notes.  Pertinent labs & imaging results that were available during my care of the patient were reviewed by me and considered in my medical decision making (see chart for details).       Pt is a 27 y.o. male who presents for 1-2 days of respiratory symptoms. Keith Dudley is afebrile here. Satting well on room air. Overall pt is ill but non-toxic appearing, well hydrated, without respiratory distress. Pulmonary exam is unremarkable.  COVID testing obtained and is positive.  Discussed antivirals and after shared decision making we will avoid them at this time.  History consistent with viral respiratory illness. Discussed symptomatic treatment.  Explained lack of efficacy of antibiotics in viral disease.  Typical duration of symptoms discussed.  Atrovent nasal spray sent to the pharmacy.  Return and ED precautions given and voiced understanding. Discussed MDM,  treatment plan and plan for follow-up with patient who agrees with plan.     Final Clinical Impressions(s) / UC Diagnoses   Final diagnoses:  COVID-19     Discharge Instructions      Your test for COVID-19 was positive, meaning that you were infected with the novel coronavirus and could give the germ to others.  The recommendations suggest returning to normal activities when, for at least 24 hours, symptoms are  improving overall, and if a fever was present, it has been gone without use of a fever-reducing medication.  You should wear a mask for the next 5 days to prevent the spread of disease. Please continue good preventive care measures, including:  frequent hand-washing, avoid touching your face, cover coughs/sneezes, stay out of crowds and keep a 6 foot distance from others.  Go to the nearest hospital emergency room if fever/cough/breathlessness are severe or illness seems like a threat to life.  If your were prescribed medication. Stop by the pharmacy to pick it up. You can take Tylenol and/or Ibuprofen as needed for fever reduction and pain relief.    For cough: honey 1/2 to 1 teaspoon (you can dilute the honey in water or another fluid).  You can also use guaifenesin and dextromethorphan for cough. You can use a humidifier for chest congestion and cough.  If you don't have a humidifier, you can sit in the bathroom with the hot shower running.      For sore throat: try warm salt water gargles, Mucinex sore throat cough drops or cepacol lozenges, throat spray, warm tea or water with lemon/honey, popsicles or ice, or OTC cold relief medicine for throat discomfort. You can also purchase chloraseptic spray at the pharmacy or dollar store.   For congestion: take a daily anti-histamine like Zyrtec, Claritin, and a oral decongestant, such as pseudoephedrine.  You can also use Flonase 1-2 sprays in each nostril daily. Afrin is also a good option, if you do not have high blood pressure.    It  is important to stay hydrated: drink plenty of fluids (water, gatorade/powerade/pedialyte, juices, or teas) to keep your throat moisturized and help further relieve irritation/discomfort.    Return or go to the Emergency Department if symptoms worsen or do not improve in the next few days     ED Prescriptions     Medication Sig Dispense Auth. Provider   ipratropium (ATROVENT) 0.06 % nasal spray Place 2 sprays into both nostrils 4 (four) times daily. 15 mL Katha Cabal, DO      PDMP not reviewed this encounter.   Katha Cabal, DO 10/22/22 2339

## 2023-03-06 ENCOUNTER — Ambulatory Visit
Admission: EM | Admit: 2023-03-06 | Discharge: 2023-03-06 | Disposition: A | Discharge status: Home / Self Care | Payer: BC Managed Care – PPO | Attending: Emergency Medicine | Admitting: Emergency Medicine

## 2023-03-06 DIAGNOSIS — J101 Influenza due to other identified influenza virus with other respiratory manifestations: Secondary | ICD-10-CM | POA: Diagnosis not present

## 2023-03-06 DIAGNOSIS — R2231 Localized swelling, mass and lump, right upper limb: Secondary | ICD-10-CM | POA: Diagnosis not present

## 2023-03-06 LAB — GROUP A STREP BY PCR: Group A Strep by PCR: NOT DETECTED

## 2023-03-06 LAB — RESP PANEL BY RT-PCR (FLU A&B, COVID) ARPGX2
Influenza A by PCR: POSITIVE — AB
Influenza B by PCR: NEGATIVE
SARS Coronavirus 2 by RT PCR: NEGATIVE

## 2023-03-06 MED ORDER — FLUTICASONE PROPIONATE 50 MCG/ACT NA SUSP: 2.0000 | Freq: Every day | NASAL | 0 refills | Status: DC

## 2023-03-06 MED ORDER — PROMETHAZINE-DM 6.25-15 MG/5ML PO SYRP: 5.0000 mL | ORAL_SOLUTION | Freq: Four times a day (QID) | ORAL | 0 refills | Status: DC | PRN

## 2023-03-06 MED ORDER — IBUPROFEN 600 MG PO TABS: 600.0000 mg | ORAL_TABLET | Freq: Three times a day (TID) | ORAL | 0 refills | Status: DC | PRN

## 2023-03-06 MED ORDER — ONDANSETRON 8 MG PO TBDP: ORAL_TABLET | ORAL | 0 refills | Status: DC

## 2023-03-06 NOTE — Discharge Instructions (Signed)
Promethazine DM for cough, 1000 mg of Tylenol combined with 600 mg of ibuprofen 3-4 times a day as needed for body aches, headaches, Flonase, saline nasal irrigation with a NeilMed sinus rinse and distilled water as often as you want to prevent a bacterial sinus infection.  Mucinex D nasal congestion, Zofran for nausea.  Push electrolyte containing fluids such as Pedialyte, Gatorade or liquid IV.  Go to the emergency department for neck stiffness, worst headache of your life, inability to keep anything down despite the Zofran, occultly breathing, or other concerns.  Follow-up with the primary care provider we set you up with prior to discharge

## 2023-03-06 NOTE — ED Provider Notes (Signed)
HPI  SUBJECTIVE:  Keith Dudley is a 28 y.o. male who presents with 3 days of headache, nasal congestion, body aches, diarrhea, sore throat, sinus pain and pressure, cough productive of yellow phlegm, shortness of breath, nausea.  States that he has felt feverish, but did not measure his temperature.  He reports "a little" wheezing last night, none since.  No rhinorrhea, postnasal drip, dyspnea on exertion, vomiting, abdominal pain.  No influenza exposure.  He did not get the influenza vaccine.  He is unable to sleep at night because of the cough.  No antipyretic in the past 6 hours.  No antibiotics in the past 3 months.  He tried Benadryl with improvement in his symptoms.  He also tried pushing fluids.  No aggravating factors. Patient has a past medical history of alcoholic gastritis with bleeding, and is status post tonsillectomy.  States he no longer drinks.  He has no other medical problems.  He also is wondering about a nontender mass on his right forearm.  No pain, overlying erythema.  It has not changed in size recently.  Past Medical History:  Diagnosis Date   Alcoholic gastritis with bleeding     Past Surgical History:  Procedure Laterality Date   DENTAL RESTORATION/EXTRACTION WITH X-RAY     TONSILLECTOMY Bilateral 12/23/2021   Procedure: TONSILLECTOMY;  Surgeon: Vernie Murders, MD;  Location: [facility];  Service: ENT;  Laterality: Bilateral;    Family History  Problem Relation Age of Onset   Healthy Mother    Healthy Father     Social History   Tobacco Use   Smoking status: Former    Types: Cigarettes   Smokeless tobacco: Never  Vaping Use   Vaping status: Never Used  Substance Use Topics   Alcohol use: Yes    Comment: social   Drug use: Yes    Types: Marijuana    Comment: occasionaly    No current facility-administered medications for this encounter.  Current Outpatient Medications:    fluticasone (FLONASE) 50 MCG/ACT nasal spray, Place 2 sprays into  both nostrils daily., Disp: 16 g, Rfl: 0   ibuprofen (ADVIL) 600 MG tablet, Take 1 tablet (600 mg total) by mouth every 8 (eight) hours as needed., Disp: 30 tablet, Rfl: 0   ondansetron (ZOFRAN-ODT) 8 MG disintegrating tablet, 1/2- 1 tablet q 8 hr prn nausea, vomiting, Disp: 20 tablet, Rfl: 0   promethazine-dextromethorphan (PROMETHAZINE-DM) 6.25-15 MG/5ML syrup, Take 5 mLs by mouth 4 (four) times daily as needed for cough., Disp: 118 mL, Rfl: 0  No Known Allergies   ROS  As noted in HPI.   Physical Exam  BP (!) 128/92 (BP Location: Left Arm)   Pulse 94   Temp 99.6 F (37.6 C) (Oral)   Resp 19   SpO2 96%   Constitutional: Well developed, well nourished, no acute distress Eyes:  EOMI, conjunctiva normal bilaterally HENT: Normocephalic, atraumatic,mucus membranes moist.  Clear nasal congestion.  Positive erythematous, swollen turbinates.  Positive mild maxillary, frontal sinus tenderness.  Tonsils surgically absent.  Slightly erythematous oropharynx.  Positive postnasal drip. Neck: Positive cervical lymphadenopathy Respiratory: Normal inspiratory effort, lungs clear bilaterally, good air movement.  Positive lateral chest wall tenderness. Cardiovascular: Normal rate, regular rhythm, no murmurs rubs or gallops. GI: nondistended skin: Nontender 1 cm mass right forearm with no overlying erythema, induration, central fluctuance. Musculoskeletal: no deformities Neurologic: Alert & oriented x 3, no focal neuro deficits Psychiatric: Speech and behavior appropriate   ED Course   Medications -  No data to display  Orders Placed This Encounter  Procedures   Resp Panel by RT-PCR (Flu A&B, Covid) Anterior Nasal Swab    Standing Status:   Standing    Number of Occurrences:   1    Patient immune status:   Normal    Release to patient:   Immediate [1]   Group A Strep by PCR    Standing Status:   Standing    Number of Occurrences:   1    Patient immune status:   Normal    Release to  patient:   Immediate [1]   Nursing Communication Please set up with a PCP prior to discharge    Please set up with a PCP prior to discharge    Standing Status:   Standing    Number of Occurrences:   1    Results for orders placed or performed during the hospital encounter of 03/06/23 (from the past 24 hours)  Resp Panel by RT-PCR (Flu A&B, Covid) Anterior Nasal Swab     Status: Abnormal   Collection Time: 03/06/23  2:52 PM   Specimen: Anterior Nasal Swab  Result Value Ref Range   SARS Coronavirus 2 by RT PCR NEGATIVE NEGATIVE   Influenza A by PCR POSITIVE (A) NEGATIVE   Influenza B by PCR NEGATIVE NEGATIVE  Group A Strep by PCR     Status: None   Collection Time: 03/06/23  2:52 PM   Specimen: Anterior Nasal Swab; Sterile Swab  Result Value Ref Range   Group A Strep by PCR NOT DETECTED NOT DETECTED   No results found.  ED Clinical Impression  1. Influenza A   2. Mass of right upper extremity      ED Assessment/Plan     1.  Influenza A positive.  COVID, strep PCR negative.  Unfortunately, patient is out of the treatment window for Tamiflu.  Discussed this with him.  Will send home with Promethazine DM, Tylenol/ibuprofen, Flonase, saline nasal irrigation, Mucinex D, Zofran.  Advise patient to push electrolyte containing fluids such as Pedialyte, Gatorade or liquid IV.  Work note for 3 days.  Will set up with PCP prior to discharge.  Written ER return precautions given.  2.  Mass right forearm.  Suspect epithelial inclusion cyst.  No evidence of infection.  No indications for I&D.  Patient can follow-up with his PCP for this.  Discussed labs, MDM, treatment plan, and plan for follow-up with patient. Discussed sn/sx that should prompt return to the ED. patient agrees with plan.   Meds ordered this encounter  Medications   fluticasone (FLONASE) 50 MCG/ACT nasal spray    Sig: Place 2 sprays into both nostrils daily.    Dispense:  16 g    Refill:  0   ibuprofen (ADVIL) 600 MG  tablet    Sig: Take 1 tablet (600 mg total) by mouth every 8 (eight) hours as needed.    Dispense:  30 tablet    Refill:  0   promethazine-dextromethorphan (PROMETHAZINE-DM) 6.25-15 MG/5ML syrup    Sig: Take 5 mLs by mouth 4 (four) times daily as needed for cough.    Dispense:  118 mL    Refill:  0   ondansetron (ZOFRAN-ODT) 8 MG disintegrating tablet    Sig: 1/2- 1 tablet q 8 hr prn nausea, vomiting    Dispense:  20 tablet    Refill:  0      *This clinic note was created using Scientist, clinical (histocompatibility and immunogenetics).  Therefore, there may be occasional mistakes despite careful proofreading.  ?    Domenick Gong, MD 03/08/23 1712

## 2023-03-06 NOTE — ED Triage Notes (Addendum)
Sx since Sat  Headache Productive cough with yellow mucus Nausea  Joint pain  Feverish   Patient also has a small lump on right forearm

## 2023-03-30 ENCOUNTER — Ambulatory Visit: Payer: Self-pay | Admitting: Physician Assistant

## 2023-04-16 ENCOUNTER — Encounter: Payer: Self-pay | Admitting: Emergency Medicine

## 2023-04-16 ENCOUNTER — Ambulatory Visit
Admission: EM | Admit: 2023-04-16 | Discharge: 2023-04-16 | Disposition: A | Discharge status: Home / Self Care | Attending: Emergency Medicine | Admitting: Emergency Medicine

## 2023-04-16 DIAGNOSIS — L03011 Cellulitis of right finger: Secondary | ICD-10-CM

## 2023-04-16 MED ORDER — AMOXICILLIN-POT CLAVULANATE 875-125 MG PO TABS: 1.0000 | ORAL_TABLET | Freq: Two times a day (BID) | ORAL | 0 refills | Status: AC

## 2023-04-16 NOTE — ED Triage Notes (Signed)
 Patient c/o swelling and pain in his right 2nd finger for a week.  Patient denies any drainage at this time.

## 2023-04-16 NOTE — ED Provider Notes (Signed)
 [facility]    CSN: [id number] Arrival date & time: 04/16/23  0806      History   Chief Complaint Chief Complaint  Patient presents with   Hand Pain    Right 2nd finger    HPI Keith Dudley is a 28 y.o. male.   HPI  28 year old male with past medical history significant for tobacco use disorder, cannabis use disorder, alcohol induced gastritis, adjustment disorder with mixed disturbance of emotions and conduct, benzodiazepine overdose, and severe major depression presents for evaluation of pain and swelling to the right index finger that has been present for the past week.  He reports that it is painful but it is not draining.  He denies fevers.  He does report chewing his fingernails.  Past Medical History:  Diagnosis Date   Alcoholic gastritis with bleeding     Patient Active Problem List   Diagnosis Date Noted   Tonsillitis 12/02/2021   Cannabis use disorder, moderate, dependence (HCC) 08/19/2017   Tobacco use disorder 08/19/2017   Alcohol use disorder, moderate, dependence (HCC) 08/17/2017   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   Gastritis 08/17/2017   Severe major depression, single episode, without psychotic features (HCC) 08/16/2017   Benzodiazepine overdose 08/16/2017    Past Surgical History:  Procedure Laterality Date   DENTAL RESTORATION/EXTRACTION WITH X-RAY     TONSILLECTOMY Bilateral 12/23/2021   Procedure: TONSILLECTOMY;  Surgeon: Vernie Murders, MD;  Location: [facility];  Service: ENT;  Laterality: Bilateral;       Home Medications    Prior to Admission medications   Medication Sig Start Date End Date Taking? Authorizing Provider  amoxicillin-clavulanate (AUGMENTIN) 875-125 MG tablet Take 1 tablet by mouth every 12 (twelve) hours for 7 days. 04/16/23 04/23/23 Yes Becky Augusta, NP  fluticasone [facility]) 50 MCG/ACT nasal spray Place 2 sprays into both nostrils daily. 03/06/23   Domenick Gong, MD  ibuprofen (ADVIL) 600 MG tablet Take 1 tablet (600 mg total) by mouth every 8 (eight) hours as needed. 03/06/23   Domenick Gong, MD  mirtazapine (REMERON) 15 MG tablet Take 1 tablet (15 mg total) by mouth at bedtime. 08/21/17 06/18/20  Pucilowska, Ellin Goodie, MD  pantoprazole (PROTONIX) 40 MG tablet Take 1 tablet (40 mg total) by mouth 2 (two) times daily. 08/21/17 09/12/19  Pucilowska, Ellin Goodie, MD  traZODone (DESYREL) 100 MG tablet Take 1 tablet (100 mg total) by mouth at bedtime as needed for sleep. 08/21/17 06/18/20  PucilowskaEllin Goodie, MD    Family History Family History  Problem Relation Age of Onset   Healthy Mother    Healthy Father     Social History Social History   Tobacco Use   Smoking status: Former    Types: Cigarettes   Smokeless tobacco: Never  Vaping Use   Vaping status: Never Used  Substance Use Topics   Alcohol use: Yes    Comment: social   Drug use: Yes    Types: Marijuana    Comment: occasionaly     Allergies   Patient has no known allergies.   Review of Systems Review of Systems  Constitutional:  Negative for fever.  Musculoskeletal:        Swelling to the cuticle of the right index finger.  Skin:  Positive for color change.     Physical Exam Triage Vital Signs ED Triage Vitals [04/16/23 0815]  Encounter Vitals Group     BP      Systolic  BP Percentile      Diastolic BP Percentile      Pulse      Resp      Temp      Temp src      SpO2      Weight 189 lb (85.7 kg)     Height 5\' 11"  (1.803 m)     Head Circumference      Peak Flow      Pain Score 8     Pain Loc      Pain Education      Exclude from Growth Chart    No data found.  Updated Vital Signs BP 113/72 (BP Location: Right Arm)   Pulse (!) 55   Temp 97.9 F (36.6 C) (Oral)   Resp 15   Ht 5\' 11"  (1.803 m)   Wt 189 lb (85.7 kg)   SpO2 98%   BMI 26.36 kg/m   Visual Acuity Right Eye Distance:   Left Eye Distance:   Bilateral Distance:    Right Eye  Near:   Left Eye Near:    Bilateral Near:     Physical Exam Vitals and nursing note reviewed.  Constitutional:      Appearance: Normal appearance. He is not ill-appearing.  HENT:     Head: Normocephalic and atraumatic.  Musculoskeletal:        General: Swelling and tenderness present. No deformity. Normal range of motion.  Skin:    General: Skin is warm and dry.     Capillary Refill: Capillary refill takes less than 2 seconds.     Findings: Erythema present.  Neurological:     General: No focal deficit present.     Mental Status: He is alert and oriented to person, place, and time.      UC Treatments / Results  Labs (all labs ordered are listed, but only abnormal results are displayed) Labs Reviewed - No data to display  EKG   Radiology No results found.  Procedures Procedures (including critical care time)  Medications Ordered in UC Medications - No data to display  Initial Impression / Assessment and Plan / UC Course  I have reviewed the triage vital signs and the nursing notes.  Pertinent labs & imaging results that were available during my care of the patient were reviewed by me and considered in my medical decision making (see chart for details).   Patient is a pleasant, nontoxic-appearing 28 year old male presenting for evaluation of a paronychia to his right index finger as outlined HPI above.  As you can see in image above, there is a pus pocket forming along the proximal and medial aspect of the cuticle.  I gave the patient the option of applying warm compresses and attempting to get the lesion to rupture on its own or to perform an I&D and he selected the I&D.  I cleansed the finger with alcohol and anesthetized it with 0.5 mL of 1% lidocaine without epi.  Once good anesthesia was achieved I cleansed the finger with chlorhexidine and made a stab incision with a #11 blade.  I then milked approximately 1 mL of thick pus.  I then cleansed the area with alcohol  and applied bacitracin and a nonstick dressing secured with Coban.  Patient reports that following the I&D he did become a little lightheaded so I reclined him on the stretcher and his symptoms improved.  I will discharge him home with a diagnosis of paronychia on Augmentin 875 mg twice daily  for 7 days.  I have instructed him to soak his finger in warm water and Epsom salts twice daily and apply bacitracin and a nonstick dressing for the next 3 days.  After that he can stop applying a dressing as the drainage should have stopped and leave it open to air at home.  He does need to cover with a Band-Aid when he is in public.  Return precautions reviewed.  Final Clinical Impressions(s) / UC Diagnoses   Final diagnoses:  Paronychia of right index finger     Discharge Instructions      Take the Augmentin twice daily with food for 7 days.  Soak your finger in warm water and Epsom salts 2-3 times a day to help facilitate drainage.  Keep a dressing on your finger until the drainage has stopped.  You can use over-the-counter Tylenol and ibuprofen according to the package instructions as needed for pain.  Return for reevaluation if you develop any increased redness, swelling, drainage, or red streaks going up your finger, or fever.      ED Prescriptions     Medication Sig Dispense Auth. Provider   amoxicillin-clavulanate (AUGMENTIN) 875-125 MG tablet Take 1 tablet by mouth every 12 (twelve) hours for 7 days. 14 tablet Becky Augusta, NP      PDMP not reviewed this encounter.   Becky Augusta, NP 04/16/23 [phone]

## 2023-04-16 NOTE — Discharge Instructions (Addendum)
 Take the Augmentin twice daily with food for 7 days.  Soak your finger in warm water and Epsom salts 2-3 times a day to help facilitate drainage.  Keep a dressing on your finger until the drainage has stopped.  You can use over-the-counter Tylenol and ibuprofen according to the package instructions as needed for pain.  Return for reevaluation if you develop any increased redness, swelling, drainage, or red streaks going up your finger, or fever.

## 2023-04-26 ENCOUNTER — Inpatient Hospital Stay: Admit: 2023-04-26

## 2023-04-26 ENCOUNTER — Ambulatory Visit
Admission: EM | Admit: 2023-04-26 | Discharge: 2023-04-26 | Disposition: A | Discharge status: Home / Self Care | Attending: Family Medicine | Admitting: Family Medicine

## 2023-04-26 DIAGNOSIS — M79644 Pain in right finger(s): Secondary | ICD-10-CM | POA: Diagnosis not present

## 2023-04-26 DIAGNOSIS — M79645 Pain in left finger(s): Secondary | ICD-10-CM

## 2023-04-26 MED ORDER — DOXYCYCLINE HYCLATE 100 MG PO CAPS: 100.0000 mg | ORAL_CAPSULE | Freq: Two times a day (BID) | ORAL | 0 refills | Status: DC

## 2023-04-26 NOTE — ED Provider Notes (Signed)
 [facility]    CSN: [id number] Arrival date & time: 04/26/23  1523      History   Chief Complaint Chief Complaint  Patient presents with   Hand Pain    HPI Keith Dudley is a 28 y.o. male.   HPI  Keith Dudley presents for ongoing finger pain that started about 2 weeks ago. Took his antibiotics after the drainage of the infection but continues to have finger pain. Pt had finger infection drained in the urgent care on 3/9. No fever or bony pain. The finger is darker color now which concerns him.   Keith Dudley has otherwise been well and has no other concerns.   Past Medical History:  Diagnosis Date   Alcoholic gastritis with bleeding     Patient Active Problem List   Diagnosis Date Noted   Tonsillitis 12/02/2021   Cannabis use disorder, moderate, dependence (HCC) 08/19/2017   Tobacco use disorder 08/19/2017   Alcohol use disorder, moderate, dependence (HCC) 08/17/2017   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   Gastritis 08/17/2017   Severe major depression, single episode, without psychotic features (HCC) 08/16/2017   Benzodiazepine overdose 08/16/2017    Past Surgical History:  Procedure Laterality Date   DENTAL RESTORATION/EXTRACTION WITH X-RAY     TONSILLECTOMY Bilateral 12/23/2021   Procedure: TONSILLECTOMY;  Surgeon: Vernie Murders, MD;  Location: [facility];  Service: ENT;  Laterality: Bilateral;       Home Medications    Prior to Admission medications   Medication Sig Start Date End Date Taking? Authorizing Provider  doxycycline (VIBRAMYCIN) 100 MG capsule Take 1 capsule (100 mg total) by mouth 2 (two) times daily. 04/26/23  Yes Minie Roadcap, DO  fluticasone (FLONASE) 50 MCG/ACT nasal spray Place 2 sprays into both nostrils daily. 03/06/23   Domenick Gong, MD  ibuprofen (ADVIL) 600 MG tablet Take 1 tablet (600 mg total) by mouth every 8 (eight) hours as needed. 03/06/23   Domenick Gong, MD  mirtazapine  (REMERON) 15 MG tablet Take 1 tablet (15 mg total) by mouth at bedtime. 08/21/17 06/18/20  Pucilowska, Ellin Goodie, MD  pantoprazole (PROTONIX) 40 MG tablet Take 1 tablet (40 mg total) by mouth 2 (two) times daily. 08/21/17 09/12/19  Pucilowska, Ellin Goodie, MD  traZODone (DESYREL) 100 MG tablet Take 1 tablet (100 mg total) by mouth at bedtime as needed for sleep. 08/21/17 06/18/20  PucilowskaEllin Goodie, MD    Family History Family History  Problem Relation Age of Onset   Healthy Mother    Healthy Father     Social History Social History   Tobacco Use   Smoking status: Former    Types: Cigarettes   Smokeless tobacco: Never  Vaping Use   Vaping status: Never Used  Substance Use Topics   Alcohol use: Yes    Comment: social   Drug use: Yes    Types: Marijuana    Comment: occasionaly     Allergies   Patient has no known allergies.   Review of Systems Review of Systems :negative unless otherwise stated in HPI.      Physical Exam Triage Vital Signs ED Triage Vitals [04/26/23 1534]  Encounter Vitals Group     BP (!) 140/92     Systolic BP Percentile      Diastolic BP Percentile      Pulse Rate 60     Resp 15     Temp 99 F (37.2 C)     Temp Source  Oral     SpO2 99 %     Weight      Height      Head Circumference      Peak Flow      Pain Score 0     Pain Loc      Pain Education      Exclude from Growth Chart    No data found.  Updated Vital Signs BP (!) 140/92 (BP Location: Left Arm)   Pulse 60   Temp 99 F (37.2 C) (Oral)   Resp 15   SpO2 99%   Visual Acuity Right Eye Distance:   Left Eye Distance:   Bilateral Distance:    Right Eye Near:   Left Eye Near:    Bilateral Near:     Physical Exam  GEN: alert, well appearing male, in no acute distress  CV: regular rate, strong radial pulse, brisk cap refill  RESP: no increased work of breathing MSK: right Hand: Inspection: No obvious deformity b/l. No swelling, erythema or bruising b/l Palpation: no TTP  of metacarpals, PIP or DIP joints  ROM: Full ROM of the digits and wrist b/l. Fully able to extend and flex finger. Neurovascular: NV intact b/l SKIN: warm and dry; hyperpigmentation of distal right index finger, finger scales present at the lateral edge likely associated with recent procedure and hangnail.   UC Treatments / Results  Labs (all labs ordered are listed, but only abnormal results are displayed) Labs Reviewed - No data to display  EKG   Radiology No results found.  Procedures Procedures (including critical care time)  Medications Ordered in UC Medications - No data to display  Initial Impression / Assessment and Plan / UC Course  I have reviewed the triage vital signs and the nursing notes.  Pertinent labs & imaging results that were available during my care of the patient were reviewed by me and considered in my medical decision making (see chart for details).     Patient is a 28 y.o. malewho presents for right index finger pain with hyperpigmentation.  Overall, patient is well-appearing and well-hydrated.  Vital signs stable.  Keith Dudley is afebrile. Pt had paronychia drained on 04/16/23 and was prescribed Augmentin for infection. Add MRSA coverage with doxycycline BID for 10 days. If not improving, pt may need ED evaluation for possible a CT of his finger to evaluate for possible osteonecrosis of finger pad infection.   Understanding voiced.    Reviewed expectations regarding course of current medical issues.  All questions asked were answered.  Outlined signs and symptoms indicating need for more acute intervention. Patient verbalized understanding. After Visit Summary given.   Final Clinical Impressions(s) / UC Diagnoses   Final diagnoses:  Finger pain, left  Finger pain, right     Discharge Instructions      Stop by the pharmacy to pick up your prescriptions.  If pain not improving, go to the hospital emergency department as you may need a CT scan.  The  hyperpigmentation may be due to recent procedure and trauma/manipulation of the finger.         ED Prescriptions     Medication Sig Dispense Auth. Provider   doxycycline (VIBRAMYCIN) 100 MG capsule Take 1 capsule (100 mg total) by mouth 2 (two) times daily. 20 capsule Katha Cabal, DO      PDMP not reviewed this encounter.              Katha Cabal, DO 04/26/23 1648

## 2023-04-26 NOTE — ED Triage Notes (Signed)
 Pt was here on 3-9 with Paronychia. Patient states that finger is getting better but skin is black around the area. Skin does have some discoloration to the area. Right pointing finger.

## 2023-04-26 NOTE — Discharge Instructions (Addendum)
 Stop by the pharmacy to pick up your prescriptions.  If pain not improving, go to the hospital emergency department as you may need a CT scan.  The hyperpigmentation may be due to recent procedure and trauma/manipulation of the finger.

## 2023-05-10 ENCOUNTER — Ambulatory Visit
Admission: RE | Admit: 2023-05-10 | Discharge: 2023-05-10 | Disposition: A | Discharge status: Home / Self Care | Source: Ambulatory Visit | Attending: Family Medicine | Admitting: Family Medicine

## 2023-05-10 ENCOUNTER — Ambulatory Visit (INDEPENDENT_AMBULATORY_CARE_PROVIDER_SITE_OTHER)

## 2023-05-10 VITALS — BP 118/70 | HR 76 | Temp 98.4°F | Resp 16

## 2023-05-10 DIAGNOSIS — M79644 Pain in right finger(s): Secondary | ICD-10-CM

## 2023-05-10 DIAGNOSIS — M7989 Other specified soft tissue disorders: Secondary | ICD-10-CM | POA: Diagnosis not present

## 2023-05-10 DIAGNOSIS — Z9189 Other specified personal risk factors, not elsewhere classified: Secondary | ICD-10-CM

## 2023-05-10 MED ORDER — DOXYCYCLINE HYCLATE 100 MG PO CAPS: 100.0000 mg | ORAL_CAPSULE | Freq: Two times a day (BID) | ORAL | 0 refills | Status: DC

## 2023-05-10 NOTE — ED Provider Notes (Signed)
 [facility]    CSN: [id number] Arrival date & time: 05/10/23  1525      History   Chief Complaint Chief Complaint  Patient presents with   finger swelling     HPI  HPI Keith Dudley is a 28 y.o. male.   Keith Dudley presents for right index finger pain that got worse while walking last night. He put some spray on it.  Took his antibiotics for only 6 days. Last night at work his finger started swelling again and being painful. No fever.   Keith Dudley has otherwise been well and has no other concerns.      Past Medical History:  Diagnosis Date   Alcoholic gastritis with bleeding     Patient Active Problem List   Diagnosis Date Noted   Tonsillitis 12/02/2021   Cannabis use disorder, moderate, dependence (HCC) 08/19/2017   Tobacco use disorder 08/19/2017   Alcohol use disorder, moderate, dependence (HCC) 08/17/2017   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   Gastritis 08/17/2017   Severe major depression, single episode, without psychotic features (HCC) 08/16/2017   Benzodiazepine overdose 08/16/2017    Past Surgical History:  Procedure Laterality Date   DENTAL RESTORATION/EXTRACTION WITH X-RAY     TONSILLECTOMY Bilateral 12/23/2021   Procedure: TONSILLECTOMY;  Surgeon: Vernie Murders, MD;  Location: [facility];  Service: ENT;  Laterality: Bilateral;       Home Medications    Prior to Admission medications   Medication Sig Start Date End Date Taking? Authorizing Provider  doxycycline (VIBRAMYCIN) 100 MG capsule Take 1 capsule (100 mg total) by mouth 2 (two) times daily. 05/10/23   Keith Dudley, Seward Meth, DO  fluticasone (FLONASE) 50 MCG/ACT nasal spray Place 2 sprays into both nostrils daily. 03/06/23   Domenick Gong, MD  ibuprofen (ADVIL) 600 MG tablet Take 1 tablet (600 mg total) by mouth every 8 (eight) hours as needed. 03/06/23   Domenick Gong, MD  mirtazapine (REMERON) 15 MG tablet Take 1 tablet (15 mg total) by  mouth at bedtime. 08/21/17 06/18/20  Pucilowska, Ellin Goodie, MD  pantoprazole (PROTONIX) 40 MG tablet Take 1 tablet (40 mg total) by mouth 2 (two) times daily. 08/21/17 09/12/19  Pucilowska, Ellin Goodie, MD  traZODone (DESYREL) 100 MG tablet Take 1 tablet (100 mg total) by mouth at bedtime as needed for sleep. 08/21/17 06/18/20  PucilowskaEllin Goodie, MD    Family History Family History  Problem Relation Age of Onset   Healthy Mother    Healthy Father     Social History Social History   Tobacco Use   Smoking status: Former    Types: Cigarettes   Smokeless tobacco: Never  Vaping Use   Vaping status: Never Used  Substance Use Topics   Alcohol use: Yes    Comment: social   Drug use: Yes    Types: Marijuana    Comment: occasionaly     Allergies   Patient has no known allergies.   Review of Systems Review of Systems: :negative unless otherwise stated in HPI.      Physical Exam Triage Vital Signs ED Triage Vitals  Encounter Vitals Group     BP 05/10/23 1547 118/70     Systolic BP Percentile --      Diastolic BP Percentile --      Pulse Rate 05/10/23 1547 76     Resp 05/10/23 1547 16     Temp 05/10/23 1547 98.4 F (36.9 C)     Temp  Source 05/10/23 1547 Oral     SpO2 05/10/23 1547 96 %     Weight --      Height --      Head Circumference --      Peak Flow --      Pain Score 05/10/23 1546 0     Pain Loc --      Pain Education --      Exclude from Growth Chart --    No data found.  Updated Vital Signs BP 118/70 (BP Location: Right Arm)   Pulse 76   Temp 98.4 F (36.9 C) (Oral)   Resp 16   SpO2 96%   Visual Acuity Right Eye Distance:   Left Eye Distance:   Bilateral Distance:    Right Eye Near:   Left Eye Near:    Bilateral Near:     Physical Exam GEN: alert, well appearing male, in no acute distress  CV: regular rate, strong radial pulse, brisk cap refill  RESP: no increased work of breathing MSK:  Right Hand: Inspection: No obvious deformity b/l. +  distal index finger swelling, No erythema or bruising b/l Palpation: no TTP of metacarpals, PIP or DIP joints  ROM: Full ROM of the digits and wrist b/l. Fully able to extend and flex finger. Neurovascular: NV intact b/l SKIN: warm and dry; hyperpigmentation of distal right index finger has improved   UC Treatments / Results  Labs (all labs ordered are listed, but only abnormal results are displayed) Labs Reviewed - No data to display  EKG   Radiology DG Finger Index Right Result Date: 05/10/2023 CLINICAL DATA:  Recurrent finger infection EXAM: RIGHT INDEX FINGER 2+V COMPARISON:  None Available. FINDINGS: Frontal, oblique, and lateral views of the right second digit are obtained. There are no acute or destructive bony abnormalities. Joint spaces are well preserved. Alignment is anatomic. Mild soft tissue swelling of the distal aspect of the second digit. No radiopaque foreign body or subcutaneous gas. IMPRESSION: 1. Distal soft tissue swelling.  No evidence of osteomyelitis. Electronically Signed   By: Sharlet Salina M.D.   On: 05/10/2023 18:13     Procedures Procedures (including critical care time)  Medications Ordered in UC Medications - No data to display  Initial Impression / Assessment and Plan / UC Course  I have reviewed the triage vital signs and the nursing notes.  Pertinent labs & imaging results that were available during my care of the patient were reviewed by me and considered in my medical decision making (see chart for details).      Patient is a 28 y.o. malewho presents for right index finger pain with edema.  Overall, patient is well-appearing and well-hydrated.  Vital signs stable.  [name] is afebrile. Pt had paronychia drained on 04/16/23 and was prescribed Augmentin for infection and on 04/26/23 for same and started on MRSA coverage with doxycycline BID for 10 days. Unfortunately, he did not take all of his antibiotics and his finger started to swell again.  Restart  antibiotics. Stressed important of taking all of his antibiotics even if his finger starts to feel better.   Xray obtained to evaluate for possible osteonecrosis, fracture or foreign body.  Xray reviewed by me with no evidence of fracture, dislocation, foreign body or osteonecrosis.  Patient aware the radiologist has not read his xray and is comfortable with the preliminary read by me. Will review radiologist read when available and call patient if a change in plan is warranted.  Pt agreeable to this plan prior to discharge.   If not improving, pt may need ED evaluation for possible a CT of his finger to evaluate for possible osteonecrosis of finger pad infection.   Understanding voiced.     Reviewed expectations regarding course of current medical issues.  All questions asked were answered.  Outlined signs and symptoms indicating need for more acute intervention. Patient verbalized understanding. After Visit Summary given.  Final Clinical Impressions(s) / UC Diagnoses   Final diagnoses:  Finger pain, right     Discharge Instructions      Keep the finger splint on for the next 1-2 weeks.  Stop by the pharmacy to pick up your prescriptions. If pain not improving, go to the hospital emergency department as you may need a CT scan.   Follow up with [facility] Dr Nydia Bouton who is a hand specialist.      ED Prescriptions     Medication Sig Dispense Auth. Provider   doxycycline (VIBRAMYCIN) 100 MG capsule Take 1 capsule (100 mg total) by mouth 2 (two) times daily. 20 capsule Katha Cabal, DO      PDMP not reviewed this encounter.   Katha Cabal, DO 05/12/23 1919

## 2023-05-10 NOTE — Discharge Instructions (Addendum)
 Keep the finger splint on for the next 1-2 weeks.  Stop by the pharmacy to pick up your prescriptions. If pain not improving, go to the hospital emergency department as you may need a CT scan.   Follow up with [facility] Dr Nydia Bouton who is a hand specialist.

## 2023-05-10 NOTE — ED Triage Notes (Signed)
 Pt was seen 3/9 and 3/19 for right pointer finger swelling. He states it got better for a few days but started to swell again last night. He reports not completing the abx.

## 2023-08-17 ENCOUNTER — Encounter: Payer: Self-pay | Admitting: Physician Assistant

## 2023-08-17 ENCOUNTER — Ambulatory Visit (INDEPENDENT_AMBULATORY_CARE_PROVIDER_SITE_OTHER): Admitting: Physician Assistant

## 2023-08-17 VITALS — BP 124/84 | HR 90 | Temp 98.2°F | Ht 71.0 in | Wt 184.0 lb

## 2023-08-17 DIAGNOSIS — D219 Benign neoplasm of connective and other soft tissue, unspecified: Secondary | ICD-10-CM | POA: Insufficient documentation

## 2023-08-17 DIAGNOSIS — C4911 Malignant neoplasm of connective and soft tissue of right upper limb, including shoulder: Secondary | ICD-10-CM | POA: Insufficient documentation

## 2023-08-17 DIAGNOSIS — L729 Follicular cyst of the skin and subcutaneous tissue, unspecified: Secondary | ICD-10-CM | POA: Insufficient documentation

## 2023-08-17 NOTE — Progress Notes (Signed)
 Date:  08/17/2023   Name:  Keith Dudley   DOB:  Jan 31, 1996   MRN:  [id number]   Chief Complaint: Cyst (X1 year, Right arm, painful when hitting it on something, hard moves slightly,never has drained before, got darer in color )  HPI Keith Dudley is a very pleasant 28 year old male new to the practice today to establish care and address a specific concern.  As stated above, he has a cyst of the right forearm present for the last year or so, not painful at rest but painful when hit incidentally on other objects.  Does not report any prior drainage.  Has not been evaluated by any providers.  He would like it removed.   Medication list has been reviewed and updated.  Current Meds  Medication Sig   fluticasone  (FLONASE ) 50 MCG/ACT nasal spray Place 2 sprays into both nostrils daily.   [DISCONTINUED] ibuprofen  (ADVIL ) 600 MG tablet Take 1 tablet (600 mg total) by mouth every 8 (eight) hours as needed.     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   Gastritis 08/17/2017   Severe major depression, single episode, without psychotic features (HCC) 08/16/2017   Benzodiazepine overdose 08/16/2017    No Known Allergies  Immunization History  Administered Date(s) Administered   DTP 09/23/1994, 12/02/1994, 01/27/1995, 09/15/1995   DTaP 09/24/1999   DTaP / IPV 05/13/2012   HIB, Unspecified 09/23/1994, 12/02/1994, 01/27/1995, 09/15/1995   Hepatitis B, ADULT 11/22/1994, 09/23/1994, 01/27/1995   IPV 09/24/1999   MMR 09/15/1995, 09/24/1999   OPV 12/02/1994, 01/27/1995, 07/11/1995, 09/15/1995   Varicella 07/15/1998    Past Surgical History:  Procedure  Laterality Date   DENTAL RESTORATION/EXTRACTION WITH X-RAY     TONSILLECTOMY Bilateral 12/23/2021   Procedure: TONSILLECTOMY;  Surgeon: Edda Mt, MD;  Location: [facility];  Service: ENT;  Laterality: Bilateral;    Social History   Tobacco Use   Smoking status: Former    Current packs/day: 0.00    Average packs/day: 0.3 packs/day for 11.5 years (2.9 ttl pk-yrs)    Types: Cigarettes    Start date: 2014    Quit date: 08/08/2023    Years since quitting: 0.0   Smokeless tobacco: Never  Vaping Use   Vaping status: Never Used  Substance Use Topics   Alcohol use: Not Currently   Drug use: Yes    Types: Marijuana    Comment: occasionaly    Family History  Problem Relation Age of Onset   Breast cancer Mother    Diabetes Paternal Grandmother         08/17/2023    4:28 PM  GAD 7 : Generalized Anxiety Score  Nervous, Anxious, on Edge 0  Control/stop worrying 1  Worry too much - different things 1  Trouble relaxing 1  Restless 2  Easily annoyed or irritable 0  Afraid - awful might happen 0  Total GAD 7 Score 5  Anxiety Difficulty Not difficult at all       08/17/2023    4:28 PM  Depression screen PHQ 2/9  Decreased Interest 0  Down, Depressed, Hopeless 1  PHQ - 2 Score 1    BP Readings from Last 3 Encounters:  08/17/23 124/84  05/10/23 118/70  04/26/23 (!) 140/92    Wt Readings from Last 3 Encounters:  08/17/23 184 lb (83.5 kg)  04/16/23 189 lb (85.7 kg)  10/16/22 169 lb 15.6 oz (77.1 kg)    BP 124/84   Pulse 90   Temp 98.2 F (36.8 C)   Ht 5' 11 (1.803 m)   Wt 184 lb (83.5 kg)   SpO2 97%   BMI 25.66 kg/m   Physical Exam Vitals and nursing note reviewed.  Constitutional:      Appearance: Normal appearance.  Cardiovascular:     Rate and Rhythm: Normal rate.  Pulmonary:     Effort: Pulmonary effort is normal.  Abdominal:     General: There is no distension.  Musculoskeletal:        General: Normal range of motion.  Skin:     General: Skin is warm and dry.     Comments: 1.5 cm firm mobile subcutaneous mass of the lateral right forearm with overlying hyperpigmentation.  It is not tender to palpation and has no spontaneous drainage.  Neurological:     Mental Status: He is alert and oriented to person, place, and time.     Gait: Gait is intact.  Psychiatric:        Mood and Affect: Mood and affect normal.     Recent Labs     Component Value Date/Time   NA 137 12/26/2021 1635   K 3.6 12/26/2021 1635   CL 103 12/26/2021 1635   CO2 24 12/26/2021 1635   GLUCOSE 99 12/26/2021 1635   BUN 17 12/26/2021 1635   CREATININE 1.04 12/26/2021 1635   CALCIUM 9.5 12/26/2021 1635   PROT 8.8 (H) 12/26/2021 1635   ALBUMIN 4.6 12/26/2021 1635   AST 18 12/26/2021 1635   ALT 15 12/26/2021 1635   ALKPHOS 59 12/26/2021 1635   BILITOT 1.1 12/26/2021 1635   GFRNONAA >60 12/26/2021 1635   GFRAA >60 10/14/2017 0321    Lab Results  Component Value Date   WBC 6.0 12/26/2021   HGB 16.1 12/26/2021   HCT 46.4 12/26/2021   MCV 85.8 12/26/2021   PLT 206 12/26/2021   Lab Results  Component Value Date   HGBA1C 5.6 08/18/2017   Lab Results  Component Value Date   CHOL 177 08/18/2017   HDL 82 08/18/2017   LDLCALC 80 08/18/2017   TRIG 73 08/18/2017   CHOLHDL 2.2 08/18/2017   Lab Results  Component Value Date   TSH 1.019 08/18/2017     Assessment and Plan:  Subcutaneous cyst Assessment & Plan: Favor sebaceous cyst.  Could also be lipoma, but is firmer than I would expect for lipoma.  Sending to dermatology for further evaluation and management.  Orders: -     Ambulatory referral to Dermatology    Patient is due for routine physical exam, will arrange for this in a couple weeks.   Return in about 2 weeks (around 08/31/2023) for CPE.    Rolan Hoyle, PA-C, DMSc, Nutritionist [facility] and Sports [facility] [phone]

## 2023-08-17 NOTE — Assessment & Plan Note (Signed)
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  Could also be lipoma, but is firmer than I would expect for lipoma.  Sending to dermatology for further evaluation and management.

## 2023-08-17 NOTE — Assessment & Plan Note (Signed)
 Favor sebaceous cyst.  Could also be lipoma, but is firmer than I would expect for lipoma.  Sending to dermatology for further evaluation and management.

## 2023-08-31 ENCOUNTER — Encounter: Admitting: Physician Assistant

## 2023-09-04 ENCOUNTER — Encounter: Payer: Self-pay | Admitting: Physician Assistant

## 2023-09-04 ENCOUNTER — Ambulatory Visit (INDEPENDENT_AMBULATORY_CARE_PROVIDER_SITE_OTHER): Admitting: Physician Assistant

## 2023-09-04 VITALS — BP 100/68 | HR 71 | Temp 98.4°F | Ht 71.0 in | Wt 187.0 lb

## 2023-09-04 DIAGNOSIS — F40298 Other specified phobia: Secondary | ICD-10-CM | POA: Insufficient documentation

## 2023-09-04 DIAGNOSIS — J309 Allergic rhinitis, unspecified: Secondary | ICD-10-CM | POA: Insufficient documentation

## 2023-09-04 DIAGNOSIS — Z0000 Encounter for general adult medical examination without abnormal findings: Secondary | ICD-10-CM

## 2023-09-04 DIAGNOSIS — Z23 Encounter for immunization: Secondary | ICD-10-CM | POA: Diagnosis not present

## 2023-09-04 NOTE — Patient Instructions (Signed)

## 2023-09-04 NOTE — Progress Notes (Signed)
 Date:  09/04/2023   Name:  Keith Dudley   DOB:  [birth year]   MRN:  [id number]   Chief Complaint: Annual Exam  HPI Keith Dudley returns today for routine physical with no particular concerns. Has appt with derm in 2 weeks to consult on the sebaceous cyst vs lipoma of arm.   Last Physical: 5y ago Last Dental Exam: 47m ago Last Eye Exam: 2wk ago  Immunizations Due: Tdap, Gardasil    Medication list has been reviewed and updated.  Current Meds  Medication Sig   fluticasone  (FLONASE ) 50 MCG/ACT nasal spray Place 2 sprays into both nostrils daily.     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   Subcutaneous cyst 08/17/2023   Episodic cannabis use 08/19/2017   Tobacco use disorder 08/19/2017   History of alcohol dependence (HCC) 08/17/2017   Recurrent major depression (HCC) 08/16/2017    No Known Allergies  Immunization History  Administered Date(s) Administered   DTP 09/23/1994, 12/02/1994, 01/27/1995, 09/15/1995   DTaP 09/24/1999   DTaP / IPV 05/13/2012   HIB, Unspecified 09/23/1994, 12/02/1994, 01/27/1995, 09/15/1995   Hepatitis B, ADULT 01/13/1995, 09/23/1994, 01/27/1995   IPV 09/24/1999   MMR 09/15/1995, 09/24/1999   OPV 12/02/1994, 01/27/1995, 07/11/1995, 09/15/1995   Varicella 07/15/1998    Past Surgical History:  Procedure Laterality Date   DENTAL RESTORATION/EXTRACTION WITH X-RAY     TONSILLECTOMY Bilateral 12/23/2021   Procedure: TONSILLECTOMY;  Surgeon: Edda Mt, MD;  Location: [facility];  Service: ENT;  Laterality: Bilateral;    Social History   Tobacco Use   Smoking status: Former    Current packs/day: 0.00    Average packs/day: 0.3 packs/day for 11.5 years (2.9 ttl pk-yrs)    Types: Cigarettes    Start date: 2014    Quit date: 08/08/2023    Years since quitting: 0.0   Smokeless tobacco: Never  Vaping Use   Vaping status: Never Used  Substance Use  Topics   Alcohol use: Yes    Comment: rarely. Used to be daily.   Drug use: Yes    Types: Marijuana    Comment: occasionally/socially. Used to be daily.    Family History  Problem Relation Age of Onset   Breast cancer Mother    Diabetes Paternal Grandmother         09/04/2023    2:17 PM 08/17/2023    4:28 PM  GAD 7 : Generalized Anxiety Score  Nervous, Anxious, on Edge 1 0  Control/stop worrying 0 1  Worry too much - different things 1 1  Trouble relaxing 1 1  Restless 1 2  Easily annoyed or irritable 1 0  Afraid - awful might happen 0 0  Total GAD 7 Score 5 5  Anxiety Difficulty Not difficult at all Not difficult at all       09/04/2023    2:17 PM 08/17/2023    4:28 PM  Depression screen PHQ 2/9  Decreased Interest 2 0  Down, Depressed, Hopeless 1 1  PHQ - 2 Score 3 1  Altered sleeping 1   Tired, decreased energy 2   Change in appetite 0   Feeling bad or failure about yourself  0   Trouble concentrating 0   Moving slowly or fidgety/restless 0   Suicidal thoughts 0   PHQ-9 Score 6   Difficult doing work/chores Not difficult at all  BP Readings from Last 3 Encounters:  09/04/23 100/68  08/17/23 124/84  05/10/23 118/70    Wt Readings from Last 3 Encounters:  09/04/23 187 lb (84.8 kg)  08/17/23 184 lb (83.5 kg)  04/16/23 189 lb (85.7 kg)    BP 100/68   Pulse 71   Temp 98.4 F (36.9 C)   Ht 5' 11 (1.803 m)   Wt 187 lb (84.8 kg)   SpO2 98%   BMI 26.08 kg/m   Physical Exam Vitals and nursing note reviewed.  Constitutional:      Appearance: Normal appearance.  HENT:     Ears:     Comments: EAC clear bilaterally with good view of TM which is without effusion or erythema.     Nose: Nose normal.     Mouth/Throat:     Mouth: Mucous membranes are moist. No oral lesions.     Dentition: Normal dentition.     Pharynx: No posterior oropharyngeal erythema.  Eyes:     Extraocular Movements: Extraocular movements intact.     Conjunctiva/sclera:  Conjunctivae normal.     Pupils: Pupils are equal, round, and reactive to light.  Neck:     Thyroid : No thyromegaly.  Cardiovascular:     Rate and Rhythm: Normal rate and regular rhythm.     Heart sounds: No murmur heard.    No friction rub. No gallop.     Comments: Pulses 2+ at radial, PT, DP bilaterally. No carotid bruit. No peripheral edema Pulmonary:     Effort: Pulmonary effort is normal.     Breath sounds: Normal breath sounds.  Abdominal:     General: Bowel sounds are normal.     Palpations: Abdomen is soft. There is no mass.     Tenderness: There is no abdominal tenderness.  Genitourinary:    Comments: Genital/rectal exam deferred. Reviewed technique for testicular self-exam. Musculoskeletal:     Comments: Full ROM with strength 5/5 bilateral upper and lower extremities  Lymphadenopathy:     Cervical: No cervical adenopathy.  Skin:    General: Skin is warm.     Capillary Refill: Capillary refill takes less than 2 seconds.     Findings: No lesion or rash.     Comments: 1.5 cm firm mobile subcutaneous mass of the lateral right forearm with overlying hyperpigmentation.  It is not tender to palpation and has no spontaneous drainage.   Neurological:     Mental Status: He is alert and oriented to person, place, and time.     Gait: Gait is intact.  Psychiatric:        Mood and Affect: Mood normal.        Behavior: Behavior normal.     Recent Labs     Component Value Date/Time   NA 137 12/26/2021 1635   K 3.6 12/26/2021 1635   CL 103 12/26/2021 1635   CO2 24 12/26/2021 1635   GLUCOSE 99 12/26/2021 1635   BUN 17 12/26/2021 1635   CREATININE 1.04 12/26/2021 1635   CALCIUM 9.5 12/26/2021 1635   PROT 8.8 (H) 12/26/2021 1635   ALBUMIN 4.6 12/26/2021 1635   AST 18 12/26/2021 1635   ALT 15 12/26/2021 1635   ALKPHOS 59 12/26/2021 1635   BILITOT 1.1 12/26/2021 1635   GFRNONAA >60 12/26/2021 1635   GFRAA >60 10/14/2017 0321    Lab Results  Component Value Date   WBC  6.0 12/26/2021   HGB 16.1 12/26/2021   HCT 46.4 12/26/2021   MCV 85.8  12/26/2021   PLT 206 12/26/2021   Lab Results  Component Value Date   HGBA1C 5.6 08/18/2017   Lab Results  Component Value Date   CHOL 177 08/18/2017   HDL 82 08/18/2017   LDLCALC 80 08/18/2017   TRIG 73 08/18/2017   CHOLHDL 2.2 08/18/2017   Lab Results  Component Value Date   TSH 1.019 08/18/2017     Assessment and Plan:  1. Annual physical exam (Primary) Encouraged healthy lifestyle including regular physical activity and consumption of whole fruits and vegetables. Encouraged routine dental and eye exams.   2. Fear of needles Will defer baseline labs, but next year (age 63) may be a good time to check these.  3. Immunization due Reviewed with patient he is due for Tdap and probably Gardasil. He declines today.     Return in about 1 year (around 09/03/2024).    Rolan Hoyle, PA-C, DMSc, Nutritionist [facility] Primary Care and Sports Medicine [facility] [phone]

## 2023-09-18 DIAGNOSIS — L720 Epidermal cyst: Secondary | ICD-10-CM | POA: Diagnosis not present

## 2023-09-25 ENCOUNTER — Ambulatory Visit: Admitting: Physician Assistant

## 2023-10-10 DIAGNOSIS — D499 Neoplasm of unspecified behavior of unspecified site: Secondary | ICD-10-CM | POA: Diagnosis not present

## 2023-10-18 ENCOUNTER — Ambulatory Visit: Payer: Self-pay

## 2023-10-18 DIAGNOSIS — M79604 Pain in right leg: Secondary | ICD-10-CM | POA: Diagnosis not present

## 2023-10-18 DIAGNOSIS — M79651 Pain in right thigh: Secondary | ICD-10-CM | POA: Diagnosis not present

## 2023-10-18 DIAGNOSIS — M79661 Pain in right lower leg: Secondary | ICD-10-CM | POA: Diagnosis not present

## 2023-10-18 DIAGNOSIS — R0602 Shortness of breath: Secondary | ICD-10-CM | POA: Diagnosis not present

## 2023-10-18 DIAGNOSIS — R000 Tachycardia, unspecified: Secondary | ICD-10-CM | POA: Diagnosis not present

## 2023-10-18 DIAGNOSIS — R791 Abnormal coagulation profile: Secondary | ICD-10-CM | POA: Diagnosis not present

## 2023-10-18 DIAGNOSIS — M25561 Pain in right knee: Secondary | ICD-10-CM | POA: Diagnosis not present

## 2023-10-18 NOTE — Telephone Encounter (Signed)
 FYI Only or Action Required?: FYI only for provider.  Patient was last seen in primary care on 09/04/2023 by Manya Toribio SQUIBB, PA.  Called Nurse Triage reporting No chief complaint on file..  Symptoms began several days ago.  Interventions attempted: Rest, hydration, or home remedies.  Symptoms are: rapidly worsening.  Triage Disposition: See HCP Within 4 Hours (Or PCP Triage)  Patient/caregiver understands and will follow disposition?:  Reason for Disposition  [1] SEVERE pain (e.g., excruciating, unable to do any normal activities) AND [2] not improved after 2 hours of pain medicine  Major surgery in past month  Answer Assessment - Initial Assessment Questions Patient reports right sided knee pain that began 2 days ago. Patient had surgery on his right arm last week for a mass removal. Has intermittent SOB, but is unclear if it is d/t the pain. Patient also reports has been in bed x 5-6 days.  Due to severity of leg pain, recent sx, potential SOB, and being in bed for an extended length of time, patient was advised to go to the ED or UC. Advised that UC may send him to the ED depending on their assessment. Pt verbalized understanding.   Requested pt call tomorrow (after being seen today in ED or UC) to follow up.  1. ONSET: When did the pain start?      2 days ago  2. LOCATION: Where is the pain located?      Right knee  3. PAIN: How bad is the pain?    (Scale 1-10; or mild, moderate, severe)     11/10  Protocols used: Leg Pain-A-AH Copied from CRM #[id number]. Topic: Clinical - Red Word Triage >> Oct 18, 2023  4:39 PM Kevelyn M wrote: Red Word that prompted transfer to Nurse Triage: Leg pain 2 days ago. He can't get up from the bed and put pressure on it and walk. Had surgery on his right arm last week.

## 2023-10-18 NOTE — Telephone Encounter (Signed)
 Called pt left VM telling him that he needs to go to the ED instead of UC.  KP

## 2023-10-19 ENCOUNTER — Telehealth: Payer: Self-pay

## 2023-10-19 NOTE — Transitions of Care (Post Inpatient/ED Visit) (Unsigned)
   10/19/2023  Name: Keith Dudley MRN: [id number] DOB: [birth year]  Today's TOC FU Call Status: Today's TOC FU Call Status:: Unsuccessful Call (1st Attempt) Unsuccessful Call (1st Attempt) Date: 10/19/23  Attempted to reach the patient regarding the most recent Inpatient/ED visit.  Follow Up Plan: Additional outreach attempts will be made to reach the patient to complete the Transitions of Care (Post Inpatient/ED visit) call.   [employer], CMA

## 2023-10-20 NOTE — Transitions of Care (Post Inpatient/ED Visit) (Signed)
   10/20/2023  Name: Starling Jakubowicz MRN: [id number] DOB: [birth year]  Today's TOC FU Call Status: Today's TOC FU Call Status:: Successful TOC FU Call Completed Unsuccessful Call (1st Attempt) Date: 10/19/23 [facility] FU Call Complete Date: 10/20/23 Patient's Name and Date of Birth confirmed.  Transition Care Management Follow-up Telephone Call Date of Discharge: 10/18/23 Discharge Facility: Other [facility]) Name of Other (Non[facility]) Discharge Facility: [facility] Type of Discharge: Emergency Department Reason for ED Visit: Other: (Pain in right leg) How have you been since you were released from the hospital?: Better Any questions or concerns?: No  Items Reviewed: Did you receive and understand the discharge instructions provided?: No Medications obtained,verified, and reconciled?: Yes (Medications Reviewed) Any new allergies since your discharge?: No Dietary orders reviewed?: NA Do you have support at home?: Yes  Medications Reviewed Today: Medications Reviewed Today     Reviewed by Dorotea Hand, Steva SAILOR, CMA (Certified Medical Assistant) on 10/20/23 at [phone]  Med List Status: <None>   Medication Order Taking? Sig Documenting Provider Last Dose Status Informant  fluticasone  (FLONASE ) 50 MCG/ACT nasal spray [id number] Yes Place 2 sprays into both nostrils daily. Van Knee, MD  Active   HYDROcodone -acetaminophen  (NORCO/VICODIN) 5-325 MG tablet [id number] Yes Take 1 tablet by mouth every 6 (six) hours as needed. [provider]  Active     Discontinued 06/18/20 1018     Discontinued 09/12/19 1111     Discontinued 06/18/20 1018             Home Care and Equipment/Supplies: Were Home Health Services Ordered?: NA Any new equipment or medical supplies ordered?: NA  Functional Questionnaire: Do you need assistance with bathing/showering or dressing?: No Do you need assistance with meal preparation?: No Do you need assistance with eating?: No Do you have  difficulty maintaining continence: No Do you need assistance with getting out of bed/getting out of a chair/moving?: No Do you have difficulty managing or taking your medications?: No  Follow up appointments reviewed: PCP Follow-up appointment confirmed?: NA (pt declined) Specialist Hospital Follow-up appointment confirmed?: NA Do you need transportation to your follow-up appointment?: No Do you understand care options if your condition(s) worsen?: Yes-patient verbalized understanding    SIGNATUREKieandra Keyera Hattabaugh, CMA

## 2023-10-26 ENCOUNTER — Encounter: Payer: Self-pay | Admitting: Physician Assistant

## 2023-10-26 ENCOUNTER — Ambulatory Visit (INDEPENDENT_AMBULATORY_CARE_PROVIDER_SITE_OTHER): Admitting: Physician Assistant

## 2023-10-26 ENCOUNTER — Telehealth: Payer: Self-pay

## 2023-10-26 VITALS — BP 102/64 | HR 78 | Temp 99.1°F | Ht 71.0 in | Wt 194.0 lb

## 2023-10-26 DIAGNOSIS — R29898 Other symptoms and signs involving the musculoskeletal system: Secondary | ICD-10-CM

## 2023-10-26 DIAGNOSIS — M79631 Pain in right forearm: Secondary | ICD-10-CM | POA: Diagnosis not present

## 2023-10-26 MED ORDER — MELOXICAM 15 MG PO TABS: 15.0000 mg | ORAL_TABLET | Freq: Every day | ORAL | 0 refills | Status: DC

## 2023-10-26 NOTE — Telephone Encounter (Signed)
 Called pt told him since we didn't see him for the problem and the stitches we can't back date him for being out of work. Told pt if he needs to be seen we can see him and if needed of necessary Dan can give him a note to be out of work moving forward.  KP

## 2023-10-26 NOTE — Progress Notes (Signed)
 Date:  10/26/2023   Name:  Keith Dudley   DOB:  09/08/1995   MRN:  [id number]   Chief Complaint: Hand Problem ( Issue with wrist and fingers locking up, has missed some days of work can't move finger or hold anything, painful, burning sensation )  HPI [name] presents to me today for postop concerns following a right forearm mass that was excised by dermatology 10/11/2023.  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  It is unclear to me at this time whether or not this is benign or malignant, as I do not have access to these notes.  Since the procedure, he has struggled with right hand weakness and right forearm pain.  He works for a [employer], and tells me this involves a lot of repetitive motions of the hand, wrist, and forearm.  There is no option for light duty with his current role.  He attempted to return to work 2 days ago (Tuesday) but was unable to complete his job responsibilities due to pain.  Yesterday morning he had his sutures removed from the forearm hoping this would help, but last night he was still unable to perform his duties.  As far as pain control is concerned, he does not have any particular regimen, but was given Norco to take as needed.  He desires a work note for the next 2 days, plans to try to go back to work on Monday.   Medication list has been reviewed and updated.  Current Meds  Medication Sig   fluticasone  (FLONASE ) 50 MCG/ACT nasal spray Place 2 sprays into both nostrils daily.   HYDROcodone -acetaminophen  (NORCO/VICODIN) 5-325 MG tablet Take 1 tablet by mouth every 6 (six) hours as needed.   meloxicam  (MOBIC ) 15 MG tablet Take 1 tablet (15 mg total) by mouth daily.     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   Subcutaneous cyst 08/17/2023   Episodic  cannabis use 08/19/2017   Tobacco use disorder 08/19/2017   History of alcohol dependence (HCC) 08/17/2017   Recurrent major depression (HCC) 08/16/2017    No Known Allergies  Immunization History  Administered Date(s) Administered   DTP 09/23/1994, 12/02/1994, 01/27/1995, 09/15/1995   DTaP 09/24/1999   DTaP / IPV 05/13/2012   HIB, Unspecified 09/23/1994, 12/02/1994, 01/27/1995, 09/15/1995   Hepatitis B, ADULT 1995/01/26, 09/23/1994, 01/27/1995   IPV 09/24/1999   MMR 09/15/1995, 09/24/1999   OPV 12/02/1994, 01/27/1995, 07/11/1995, 09/15/1995   Varicella 07/15/1998    Past Surgical History:  Procedure Laterality Date   DENTAL RESTORATION/EXTRACTION WITH X-RAY     TONSILLECTOMY Bilateral 12/23/2021   Procedure: TONSILLECTOMY;  Surgeon: Edda Mt, MD;  Location[facility];  Service: ENT;  Laterality: Bilateral;    Social History   Tobacco Use   Smoking status: Former    Current packs/day: 0.00    Average packs/day: 0.3 packs/day for 11.5 years (2.9 ttl pk-yrs)    Types: Cigarettes    Start date: 2014    Quit date: 08/08/2023    Years since quitting: 0.2   Smokeless tobacco: Never  Vaping Use   Vaping status: Never Used  Substance Use Topics   Alcohol use: Yes    Comment: rarely. Used to be daily.   Drug use: Yes    Types: Marijuana  Comment: occasionally/socially. Used to be daily.    Family History  Problem Relation Age of Onset   Breast cancer Mother    Diabetes Paternal Grandmother         10/26/2023    2:27 PM 09/04/2023    2:17 PM 08/17/2023    4:28 PM  GAD 7 : Generalized Anxiety Score  Nervous, Anxious, on Edge 0 1 0  Control/stop worrying 0 0 1  Worry too much - different things 0 1 1  Trouble relaxing 0 1 1  Restless 0 1 2  Easily annoyed or irritable 0 1 0  Afraid - awful might happen 0 0 0  Total GAD 7 Score 0 5 5  Anxiety Difficulty Not difficult at all Not difficult at all Not difficult at all       10/26/2023    2:27 PM  09/04/2023    2:17 PM 08/17/2023    4:28 PM  Depression screen PHQ 2/9  Decreased Interest 0 2 0  Down, Depressed, Hopeless 0 1 1  PHQ - 2 Score 0 3 1  Altered sleeping  1   Tired, decreased energy  2   Change in appetite  0   Feeling bad or failure about yourself   0   Trouble concentrating  0   Moving slowly or fidgety/restless  0   Suicidal thoughts  0   PHQ-9 Score  6   Difficult doing work/chores  Not difficult at all     BP Readings from Last 3 Encounters:  10/26/23 102/64  09/04/23 100/68  08/17/23 124/84    Wt Readings from Last 3 Encounters:  10/26/23 194 lb (88 kg)  09/04/23 187 lb (84.8 kg)  08/17/23 184 lb (83.5 kg)    BP 102/64   Pulse 78   Temp 99.1 F (37.3 C)   Ht 5' 11 (1.803 m)   Wt 194 lb (88 kg)   SpO2 98%   BMI 27.06 kg/m   Physical Exam Vitals and nursing note reviewed.  Constitutional:      Appearance: Normal appearance.  Cardiovascular:     Rate and Rhythm: Normal rate.  Pulmonary:     Effort: Pulmonary effort is normal.  Abdominal:     General: There is no distension.  Musculoskeletal:        General: Normal range of motion.     Comments: Right hand with decreased grip strength and painful ROM, especially with supination or with flexion at the wrist.  Fine motor function is preserved, but not quite as coordinated as the left hand.  Tinel's test at the wrist causes radiation up the forearm.  Skin:    General: Skin is warm and dry.     Comments: Surgical incision of right lateral mid forearm with what seems to be slow healing.  The skin here is hyperpigmented, rough, and the area is TTP.  There is no fluctuance, warmth, or discharge. Part of this appearance is likely due to yesterday's application of Dermabond at an urgent care.    3 Steri-Strips are applied over the site, but the wound is not open.  Neurological:     Mental Status: He is alert and oriented to person, place, and time.     Gait: Gait is intact.  Psychiatric:        Mood  and Affect: Mood and affect normal.     Recent Labs     Component Value Date/Time   NA 137 12/26/2021 1635   K  3.6 12/26/2021 1635   CL 103 12/26/2021 1635   CO2 24 12/26/2021 1635   GLUCOSE 99 12/26/2021 1635   BUN 17 12/26/2021 1635   CREATININE 1.04 12/26/2021 1635   CALCIUM 9.5 12/26/2021 1635   PROT 8.8 (H) 12/26/2021 1635   ALBUMIN 4.6 12/26/2021 1635   AST 18 12/26/2021 1635   ALT 15 12/26/2021 1635   ALKPHOS 59 12/26/2021 1635   BILITOT 1.1 12/26/2021 1635   GFRNONAA >60 12/26/2021 1635   GFRAA >60 10/14/2017 0321    Lab Results  Component Value Date   WBC 6.0 12/26/2021   HGB 16.1 12/26/2021   HCT 46.4 12/26/2021   MCV 85.8 12/26/2021   PLT 206 12/26/2021   Lab Results  Component Value Date   HGBA1C 5.6 08/18/2017   Lab Results  Component Value Date   CHOL 177 08/18/2017   HDL 82 08/18/2017   LDLCALC 80 08/18/2017   TRIG 73 08/18/2017   CHOLHDL 2.2 08/18/2017   Lab Results  Component Value Date   TSH 1.019 08/18/2017      Assessment and Plan:  1. Right hand weakness (Primary) Work note written, patient to rest until Monday.  Suspect temporary aggravation/inflammation along a nerve pathway through the forearm.  Will try to obtain further history and clarification from his dermatologist notes.  Will have to follow this closely.  Patient has purchased a stress ball to help regain his hand and wrist strength.  I have encouraged him to use this but not excessively, and also discussed gentle stretching and range of motion exercises.  2. Right forearm pain Begin meloxicam  once daily.  May continue Norco as needed, but only for severe pain. - meloxicam  (MOBIC ) 15 MG tablet; Take 1 tablet (15 mg total) by mouth daily.  Dispense: 30 tablet; Refill: 0    Follow-up to be determined   Rolan Hoyle, PA-C, DMSc, Nutritionist [facility] Primary Care and Sports Medicine [facility] [phone]

## 2023-10-26 NOTE — Telephone Encounter (Signed)
 Copied from CRM [id number]. Topic: General - Other >> Oct 26, 2023 10:35 AM DeAngela L wrote: Reason for CRM: patient 1st week back to work and when he went back he could not do the job and he had his stitches removed and he still can't do the job  Patient missed work 10/25/23 10/26/23 and he is asking if he can have letter to return to work 10/30/23 Or is this a letter that he needs to get from the office that removed the stitches   Pt num [phone] (M)

## 2023-10-30 ENCOUNTER — Encounter: Payer: Self-pay | Admitting: Physician Assistant

## 2023-10-30 ENCOUNTER — Telehealth: Payer: Self-pay

## 2023-10-30 NOTE — Telephone Encounter (Signed)
 Called for records release.  KP

## 2023-10-30 NOTE — Telephone Encounter (Signed)
-----   Message from Rolan SHAUNNA Hoyle sent at 10/26/2023  5:54 PM EDT ----- Regarding: Records Request Please call [facility] dermatology and request procedure notes and office notes for recent excision of a right forearm mass.  We are the referring provider, so no release form should be required.

## 2023-11-02 ENCOUNTER — Telehealth: Payer: Self-pay | Admitting: Physician Assistant

## 2023-11-02 NOTE — Telephone Encounter (Signed)
 Work note completed.

## 2023-11-02 NOTE — Telephone Encounter (Signed)
 Please review.  KP

## 2023-11-02 NOTE — Telephone Encounter (Signed)
 Note completed

## 2023-11-03 ENCOUNTER — Telehealth: Payer: Self-pay | Admitting: Physician Assistant

## 2023-11-03 NOTE — Telephone Encounter (Signed)
 Copied from CRM [id number]. Topic: General - Other >> Nov 03, 2023  9:31 AM Shanda MATSU wrote: Reason for CRM: Patient called in to adv tha his FMLA paperwork was faxed in on today, patient wanted to indicate that question 6 & 7 should be answered and that if his flare up's could be mentioned on the paperwork.

## 2023-11-06 ENCOUNTER — Telehealth: Payer: Self-pay | Admitting: Physician Assistant

## 2023-11-06 DIAGNOSIS — Z0279 Encounter for issue of other medical certificate: Secondary | ICD-10-CM

## 2023-11-06 NOTE — Telephone Encounter (Signed)
 Spoke with Keith Dudley for 5 minutes for his assistance in completing FMLA paperwork. Will fax it today.

## 2023-11-08 HISTORY — PX: OTHER SURGICAL HISTORY: SHX169

## 2023-11-16 ENCOUNTER — Telehealth: Payer: Self-pay | Admitting: Physician Assistant

## 2023-11-16 ENCOUNTER — Ambulatory Visit: Admitting: Physician Assistant

## 2023-11-16 ENCOUNTER — Other Ambulatory Visit: Payer: Self-pay | Admitting: Physician Assistant

## 2023-11-16 DIAGNOSIS — M79631 Pain in right forearm: Secondary | ICD-10-CM

## 2023-11-16 DIAGNOSIS — R29898 Other symptoms and signs involving the musculoskeletal system: Secondary | ICD-10-CM

## 2023-11-16 DIAGNOSIS — D2111 Benign neoplasm of connective and other soft tissue of right upper limb, including shoulder: Secondary | ICD-10-CM | POA: Diagnosis not present

## 2023-11-16 NOTE — Telephone Encounter (Signed)
 Done

## 2023-11-16 NOTE — Telephone Encounter (Signed)
 Copied from CRM [id number]. Topic: Referral - Request for Referral >> Nov 16, 2023 10:15 AM Zebedee SAUNDERS wrote: Did the patient discuss referral with their provider in the last year? Yes (If No - schedule appointment) (If Yes - send message)  Appointment offered? Yes  Type of order/referral and detailed reason for visit: Physical Therapy for right arm from surgery  Preference of office, provider, location: Pt's area  If referral order, have you been seen by this specialty before? No (If Yes, this issue or another issue? When? Where?  Can we respond through MyChart? No

## 2023-11-22 ENCOUNTER — Telehealth: Payer: Self-pay

## 2023-11-22 DIAGNOSIS — D219 Benign neoplasm of connective and other soft tissue, unspecified: Secondary | ICD-10-CM | POA: Diagnosis not present

## 2023-11-22 DIAGNOSIS — M7989 Other specified soft tissue disorders: Secondary | ICD-10-CM | POA: Diagnosis not present

## 2023-11-22 NOTE — Telephone Encounter (Signed)
 Spoke to pt this morning. He will be faxing a form for FMLA. He wants the form updated to say he can be excused when he is having flare up or when he has a ppt. Pt stated he had to leave work yesterday due to having a appt. Gave pt the fax number to our office.  KP

## 2023-12-01 DIAGNOSIS — D219 Benign neoplasm of connective and other soft tissue, unspecified: Secondary | ICD-10-CM | POA: Diagnosis not present

## 2023-12-01 DIAGNOSIS — D239 Other benign neoplasm of skin, unspecified: Secondary | ICD-10-CM | POA: Diagnosis not present

## 2023-12-01 DIAGNOSIS — D2111 Benign neoplasm of connective and other soft tissue of right upper limb, including shoulder: Secondary | ICD-10-CM | POA: Diagnosis not present

## 2023-12-20 DIAGNOSIS — D219 Benign neoplasm of connective and other soft tissue, unspecified: Secondary | ICD-10-CM | POA: Diagnosis not present

## 2024-01-01 ENCOUNTER — Telehealth: Payer: Self-pay

## 2024-01-01 ENCOUNTER — Encounter: Payer: Self-pay | Admitting: Physician Assistant

## 2024-01-01 ENCOUNTER — Telehealth: Admitting: Physician Assistant

## 2024-01-01 DIAGNOSIS — F902 Attention-deficit hyperactivity disorder, combined type: Secondary | ICD-10-CM | POA: Insufficient documentation

## 2024-01-01 MED ORDER — FLUTICASONE PROPIONATE 50 MCG/ACT NA SUSP: 2.0000 | Freq: Every day | NASAL | 0 refills | Status: AC

## 2024-01-01 MED ORDER — AMPHETAMINE-DEXTROAMPHETAMINE 20 MG PO TABS: 20.0000 mg | ORAL_TABLET | Freq: Every day | ORAL | 0 refills | Status: DC | PRN

## 2024-01-01 NOTE — Telephone Encounter (Signed)
 Noted  Called pt scheduled him a appt for today.  KP

## 2024-01-01 NOTE — Progress Notes (Unsigned)
 Date:  01/01/2024   Name:  Keith Dudley   DOB:  14-Jun-1995   MRN:  [id number]   I connected with Keith Dudley on 01/01/2024 via MyChart Video and verified that I am speaking with the correct person using appropriate identifiers. The limitations, risks, security and privacy concerns of performing an evaluation and management service by MyChart Video, including the higher likelihood of inaccurate diagnoses and treatments, and the availability of in person appointments were reviewed. The possible need of an additional face-to-face encounter for complete and high quality delivery of care was discussed. The patient was also made aware that there may be a patient responsible charge related to this service. The patient expressed understanding and wishes to proceed.   Provider location is in medical facility [facility] Primary Care and Sports Medicine at [facility]). Patient location is at their home People involved in care of the patient during this telehealth encounter were myself, my CMA, and my front office/scheduling team member.    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  Says he has always struggled with focus and attention issues, has not taken medication for this problem in the past, but has recently started a class pursuing a personal training certification.  He states that his very difficult for him to read the textbook and maintain attention.  It is also worth noting that his usual routine is shift work, although because he is out on FMLA for recent granular cell tumor excision from the arm, he is presently following a standard sleep-wake cycle.  Medication list has been reviewed and updated.  Current Meds  Medication Sig   amphetamine -dextroamphetamine  (ADDERALL) 20 MG tablet Take 1 tablet (20 mg total) by mouth daily as  needed.   [DISCONTINUED] HYDROcodone -acetaminophen  (NORCO/VICODIN) 5-325 MG tablet Take 1 tablet by mouth every 6 (six) hours as needed.     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   Attention deficit hyperactivity disorder (ADHD), combined type 01/01/2024   Allergic rhinitis 09/04/2023   Fear of needles 09/04/2023   Granular cell tumor 08/17/2023   Episodic cannabis use 08/19/2017   Tobacco use disorder 08/19/2017   History of alcohol dependence (HCC) 08/17/2017   Recurrent major depression 08/16/2017    No Known Allergies  Immunization History  Administered Date(s) Administered   DTP 09/23/1994, 12/02/1994, 01/27/1995, 09/15/1995   DTaP 09/24/1999   DTaP / IPV 05/13/2012   HIB, Unspecified 09/23/1994, 12/02/1994, 01/27/1995, 09/15/1995   Hepatitis B, ADULT 19-Jun-1994, 09/23/1994, 01/27/1995   IPV 09/24/1999   MMR 09/15/1995, 09/24/1999   OPV 12/02/1994, 01/27/1995, 07/11/1995, 09/15/1995   Varicella 07/15/1998    Past Surgical History:  Procedure Laterality Date   DENTAL RESTORATION/EXTRACTION WITH X-RAY     OTHER SURGICAL HISTORY Right 11/2023   Arm surgery-forearm   TONSILLECTOMY Bilateral 12/23/2021   Procedure: TONSILLECTOMY;  Surgeon: Edda Mt, MD;  Location: [facility];  Service: ENT;  Laterality: Bilateral;    Social History   Tobacco Use   Smoking status: Former    Current packs/day: 0.00    Average packs/day: 0.3 packs/day for 11.5 years (2.9 ttl pk-yrs)    Types: Cigarettes    Start date: 2014    Quit date: 08/08/2023    Years since quitting: 0.4   Smokeless  tobacco: Never  Vaping Use   Vaping status: Never Used  Substance Use Topics   Alcohol use: Yes    Comment: rarely. Used to be daily.   Drug use: Yes    Types: Marijuana    Comment: occasionally/socially. Used to be daily.    Family History  Problem Relation Age of Onset   Breast cancer Mother     Diabetes Paternal Grandmother         10/26/2023    2:27 PM 09/04/2023    2:17 PM 08/17/2023    4:28 PM  GAD 7 : Generalized Anxiety Score  Nervous, Anxious, on Edge 0 1 0  Control/stop worrying 0 0 1  Worry too much - different things 0 1 1  Trouble relaxing 0 1 1  Restless 0 1 2  Easily annoyed or irritable 0 1 0  Afraid - awful might happen 0 0 0  Total GAD 7 Score 0 5 5  Anxiety Difficulty Not difficult at all Not difficult at all Not difficult at all       01/01/2024    3:21 PM 10/26/2023    2:27 PM 09/04/2023    2:17 PM  Depression screen PHQ 2/9  Decreased Interest 3 0 2  Down, Depressed, Hopeless 3 0 1  PHQ - 2 Score 6 0 3  Altered sleeping 2  1  Tired, decreased energy 2  2  Change in appetite 1  0  Feeling bad or failure about yourself  1  0  Trouble concentrating 3  0  Moving slowly or fidgety/restless 0  0  Suicidal thoughts 0  0  PHQ-9 Score 15  6   Difficult doing work/chores Not difficult at all  Not difficult at all     Data saved with a previous flowsheet row definition   Adult ADHD Self Report Scale (most recent)     Adult ADHD Self-Report Scale (ASRS-v1.1) Symptom Checklist - 01/01/24 1523       Part A   1. How often do you have trouble wrapping up the final details of a project, once the challenging parts have been done? Often  2. How often do you have difficulty getting things done in order when you have to do a task that requires organization? Often    3. How often do you have problems remembering appointments or obligations? Rarely  4. When you have a task that requires a lot of thought, how often do you avoid or delay getting started? Often    5. How often do you fidget or squirm with your hands or feet when you have to sit down for a long time? Very Often  6. How often do you feel overly active and compelled to do things, like you were driven by a motor? Often      Part B   7. How often do you make careless mistakes when you have to work on a  boring or difficult project? Rarely  8. How often do you have difficulty keeping your attention when you are doing boring or repetitive work? Very Often    9. How often do you have difficulty concentrating on what people say to you, even when they are speaking to you directly? Sometimes  10. How often do you misplace or have difficulty finding things at home or at work? Often    11. How often are you distracted by activity or noise around you? Often  12. How often do you leave your seat  in meetings or other situations in which you are expected to remain seated? Often    13. How often do you feel restless or fidgety? Often  14. How often do you have difficulty unwinding and relaxing when you have time to yourself? Very Often    15. How often do you find yourself talking too much when you are in social situations? Rarely  16. When you are in a conversation, how often do you find yourself finishing the sentences of the people you are talking to, before they can finish them themselves? Sometimes    17. How often do you have difficulty waiting your turn in situations when turn taking is required? Often  18. How often do you interrupt others when they are busy? Very Often      Comment   How old were you when these problems first began to occur? 5             BP Readings from Last 3 Encounters:  10/26/23 102/64  09/04/23 100/68  08/17/23 124/84    Wt Readings from Last 3 Encounters:  10/26/23 194 lb (88 kg)  09/04/23 187 lb (84.8 kg)  08/17/23 184 lb (83.5 kg)    There were no vitals taken for this visit.  Physical Exam General: Speaking full sentences, no audible heavy breathing. Sounds alert and appropriately interactive. Well-appearing. Face symmetric. Extraocular movements intact. Pupils equal and round. No nasal flaring or accessory muscle use visualized.  Recent Labs     Component Value Date/Time   NA 137 12/26/2021 1635   K 3.6 12/26/2021 1635   CL 103 12/26/2021 1635   CO2 24  12/26/2021 1635   GLUCOSE 99 12/26/2021 1635   BUN 17 12/26/2021 1635   CREATININE 1.04 12/26/2021 1635   CALCIUM 9.5 12/26/2021 1635   PROT 8.8 (H) 12/26/2021 1635   ALBUMIN 4.6 12/26/2021 1635   AST 18 12/26/2021 1635   ALT 15 12/26/2021 1635   ALKPHOS 59 12/26/2021 1635   BILITOT 1.1 12/26/2021 1635   GFRNONAA >60 12/26/2021 1635   GFRAA >60 10/14/2017 0321    Lab Results  Component Value Date   WBC 6.0 12/26/2021   HGB 16.1 12/26/2021   HCT 46.4 12/26/2021   MCV 85.8 12/26/2021   PLT 206 12/26/2021   Lab Results  Component Value Date   HGBA1C 5.6 08/18/2017   Lab Results  Component Value Date   CHOL 177 08/18/2017   HDL 82 08/18/2017   LDLCALC 80 08/18/2017   TRIG 73 08/18/2017   CHOLHDL 2.2 08/18/2017   Lab Results  Component Value Date   TSH 1.019 08/18/2017     Assessment and Plan:  Attention deficit hyperactivity disorder (ADHD), combined type Assessment & Plan: Discussed pros and cons of stimulant medication in the treatment of ADHD.  Also discussed IR versus ER.  For the time being, we will start with a low-dose IR formulation since it sounds like he will be doing sections of studying for a few hours at a time, but not more.  We reviewed the need for ongoing follow-up every 3 months, and urine drug screen every 6 months.  We will get his first UDS at his next visit here.  Orders: -     Amphetamine -Dextroamphetamine ; Take 1 tablet (20 mg total) by mouth daily as needed.  Dispense: 30 tablet; Refill: 0  Other orders -     Fluticasone  Propionate; Place 2 sprays into both nostrils daily.  Dispense: 16 g; Refill: 0  Follow-up in 1 month OV ADHD  I discussed the above assessment and treatment plan with the patient. The patient was provided an opportunity to ask questions and all were answered. The patient agreed with the plan and demonstrated an understanding of the instructions. The patient was advised to call back or seek an in-person evaluation if  the symptoms worsen or if the condition fails to improve as anticipated. I provided a total time of 20 minutes inclusive of time utilized for medical chart review, information gathering, care coordination with staff, and documentation completion.  Rolan Hoyle, PA-C, DMSc, Nutritionist [facility] Primary Care and Sports Medicine [facility] [phone]

## 2024-01-01 NOTE — Telephone Encounter (Signed)
 Copied from CRM #[id number]. Topic: General - Other >> Jan 01, 2024  3:08 PM Berwyn MATSU wrote: Reason for CRM: patient Called in requesting to speak to [place]. Per patient he would like to speak to CMA or PA . I advised caller I would reach out.   I called CAL spoke with Jon who advised she will see if Steva is available. Lorretta not available.   I  advised caller that I will send a message and have care team return call.   Caller understood.   May you please assist.

## 2024-01-02 ENCOUNTER — Telehealth: Payer: Self-pay

## 2024-01-02 DIAGNOSIS — G4726 Circadian rhythm sleep disorder, shift work type: Secondary | ICD-10-CM | POA: Insufficient documentation

## 2024-01-02 NOTE — Telephone Encounter (Signed)
 Please complete PA for adderral.  KP

## 2024-01-02 NOTE — Assessment & Plan Note (Signed)
 Discussed pros and cons of stimulant medication in the treatment of ADHD.  Also discussed IR versus ER.  For the time being, we will start with a low-dose IR formulation since it sounds like he will be doing sections of studying for a few hours at a time, but not more.  We reviewed the need for ongoing follow-up every 3 months, and urine drug screen every 6 months.  We will get his first UDS at his next visit here.

## 2024-01-03 ENCOUNTER — Other Ambulatory Visit (HOSPITAL_COMMUNITY): Payer: Self-pay

## 2024-01-03 ENCOUNTER — Telehealth: Payer: Self-pay | Admitting: Pharmacy Technician

## 2024-01-03 NOTE — Telephone Encounter (Signed)
 Called pt he is aware.  KP

## 2024-01-03 NOTE — Telephone Encounter (Signed)
 Received notification from [facility] that Prior Authorization for Amphetamine -Dextroamphetamine  20MG  tablets has been APPROVED from 01/02/24 to 01/02/27

## 2024-01-03 NOTE — Telephone Encounter (Signed)
 Pharmacy Patient Advocate Encounter   Received notification from Pt Calls Messages that prior authorization for Amphetamine -Dextroamphetamine  20MG  tablets is required/requested.   Insurance verification completed.   The patient is insured through [facility].   Per test claim: PA required; PA started via CoverMyMeds. KEY [id number] . Waiting for clinical questions to populate.

## 2024-01-03 NOTE — Telephone Encounter (Signed)
 Pharmacy Patient Advocate Encounter  Received notification from [facility] that Prior Authorization for Amphetamine -Dextroamphetamine  20MG  tablets has been APPROVED from 01/02/24 to 01/02/27. Ran test claim, Copay is $0.00. This test claim was processed through [facility]- copay amounts may vary at other pharmacies due to pharmacy/plan contracts, or as the patient moves through the different stages of their insurance plan.   PA #/Case ID/Reference #: [id number]

## 2024-01-03 NOTE — Telephone Encounter (Signed)
 PA request has been Started. New Encounter has been or will be created for follow up. For additional info see Pharmacy Prior Auth telephone encounter from 01/03/24.

## 2024-01-08 ENCOUNTER — Telehealth: Payer: Self-pay

## 2024-01-08 NOTE — Telephone Encounter (Signed)
 Pt stated he was taking the medication but he doesn't feel anything. He doesn't feel like its working. He still can't focus. Depression and anxiety. Pt has appt Wednesday.  KP  KP

## 2024-01-08 NOTE — Telephone Encounter (Signed)
 Copied from CRM #[id number]. Topic: General - Other >> Jan 08, 2024 11:22 AM Charlet HERO wrote: Reason for CRM: Patient is callng to have dr call him back about his adderall. He is requesting to have either the doctor or nurse call him back.

## 2024-01-08 NOTE — Telephone Encounter (Signed)
 Noted. Might try bupropion instead

## 2024-01-08 NOTE — Telephone Encounter (Signed)
 Called pt left VM to call back.  KP

## 2024-01-08 NOTE — Telephone Encounter (Unsigned)
 Copied from CRM #[id number]. Topic: General - Other >> Jan 08, 2024  2:44 PM Brittany M wrote: Reason for CRM: Patient returning call for Person, Steva SAILOR, CMA

## 2024-01-10 ENCOUNTER — Encounter: Payer: Self-pay | Admitting: Physician Assistant

## 2024-01-10 ENCOUNTER — Ambulatory Visit: Admitting: Physician Assistant

## 2024-01-10 VITALS — BP 122/84 | HR 91 | Temp 98.4°F | Ht 71.0 in | Wt 194.0 lb

## 2024-01-10 DIAGNOSIS — G4700 Insomnia, unspecified: Secondary | ICD-10-CM | POA: Diagnosis not present

## 2024-01-10 DIAGNOSIS — F332 Major depressive disorder, recurrent severe without psychotic features: Secondary | ICD-10-CM | POA: Diagnosis not present

## 2024-01-10 DIAGNOSIS — Z9151 Personal history of suicidal behavior: Secondary | ICD-10-CM | POA: Insufficient documentation

## 2024-01-10 DIAGNOSIS — F902 Attention-deficit hyperactivity disorder, combined type: Secondary | ICD-10-CM | POA: Diagnosis not present

## 2024-01-10 NOTE — Assessment & Plan Note (Signed)
 Stop Adderall as it does not seem to be helping and may be worsening his anxiety and sleep issues.

## 2024-01-10 NOTE — Progress Notes (Signed)
 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  Unfortunately this does not seem to help much with his concentration and he still struggles with sleep and restlessness likely complicated by underlying anxiety and depressive symptoms in addition to recent changes to his sleep-wake cycle (was on night shift but has been out on FMLA so is following a standard day). He is planning to return to work 02/12/24 and plans to request day shift moving forward. He has arranged for online therapy, first session next week.   He has previously been prescribed mirtazapine  and trazodone  after a prior suicide attempt 2019, but he says he never took either one and would hide the pills under his tongue during admission then spit them out.   Medication list has been reviewed and updated.  Current Meds  Medication Sig   fluticasone  (FLONASE ) 50 MCG/ACT nasal spray Place 2 sprays into both nostrils daily.   [DISCONTINUED] amphetamine -dextroamphetamine  (ADDERALL) 20 MG tablet Take 1 tablet (20 mg total) by mouth daily as needed.     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   Episodic cannabis use 08/19/2017   Tobacco use disorder 08/19/2017   History of alcohol dependence (HCC) 08/17/2017   Recurrent major depression 08/16/2017    No Known Allergies  Immunization History  Administered Date(s) Administered   DTP 09/23/1994, 12/02/1994,  01/27/1995, 09/15/1995   DTaP 09/24/1999   DTaP / IPV 05/13/2012   HIB, Unspecified 09/23/1994, 12/02/1994, 01/27/1995, 09/15/1995   Hepatitis B, ADULT 06/14/1994, 09/23/1994, 01/27/1995   IPV 09/24/1999   MMR 09/15/1995, 09/24/1999   OPV 12/02/1994, 01/27/1995, 07/11/1995, 09/15/1995   Varicella 07/15/1998    Past Surgical History:  Procedure Laterality Date   DENTAL RESTORATION/EXTRACTION WITH X-RAY     OTHER SURGICAL HISTORY Right 11/2023   Arm surgery-forearm   TONSILLECTOMY Bilateral 12/23/2021   Procedure: TONSILLECTOMY;  Surgeon: Edda Mt, MD;  Location: [facility];  Service: ENT;  Laterality: Bilateral;    Social History   Tobacco Use   Smoking status: Former    Current packs/day: 0.00    Average packs/day: 0.3 packs/day for 11.5 years (2.9 ttl pk-yrs)    Types: Cigarettes    Start date: 2014    Quit date: 08/08/2023    Years since quitting: 0.4   Smokeless tobacco: Never  Vaping Use   Vaping status: Never Used  Substance Use Topics   Alcohol use: Yes    Comment: rarely. Used to be daily.   Drug use: Yes    Types: Marijuana    Comment: occasionally/socially. Used to be daily.    Family History  Problem Relation Age of Onset   Breast cancer Mother    Diabetes Paternal Grandmother         01/10/2024    8:26 AM 10/26/2023    2:27 PM 09/04/2023    2:17 PM 08/17/2023  4:28 PM  GAD 7 : Generalized Anxiety Score  Nervous, Anxious, on Edge 3 0 1 0  Control/stop worrying 2 0 0 1  Worry too much - different things 2 0 1 1  Trouble relaxing 2 0 1 1  Restless 2 0 1 2  Easily annoyed or irritable 2 0 1 0  Afraid - awful might happen 2 0 0 0  Total GAD 7 Score 15 0 5 5  Anxiety Difficulty Somewhat difficult Not difficult at all Not difficult at all Not difficult at all       01/10/2024    8:19 AM 01/01/2024    3:21 PM 10/26/2023    2:27 PM  Depression screen PHQ 2/9  Decreased Interest 2 3 0  Down, Depressed, Hopeless 3 3 0  PHQ - 2 Score 5 6  0  Altered sleeping 3 2   Tired, decreased energy 2 2   Change in appetite 3 1   Feeling bad or failure about yourself  3 1   Trouble concentrating 3 3   Moving slowly or fidgety/restless 2 0   Suicidal thoughts 1 0   PHQ-9 Score 22 15   Difficult doing work/chores Not difficult at all Not difficult at all     BP Readings from Last 3 Encounters:  01/10/24 122/84  10/26/23 102/64  09/04/23 100/68    Wt Readings from Last 3 Encounters:  01/10/24 194 lb (88 kg)  10/26/23 194 lb (88 kg)  09/04/23 187 lb (84.8 kg)    BP 122/84   Pulse 91   Temp 98.4 F (36.9 C)   Ht 5' 11 (1.803 m)   Wt 194 lb (88 kg)   SpO2 98%   BMI 27.06 kg/m   Physical Exam Vitals and nursing note reviewed.  Constitutional:      Appearance: Normal appearance.  Cardiovascular:     Rate and Rhythm: Normal rate.  Pulmonary:     Effort: Pulmonary effort is normal.  Abdominal:     General: There is no distension.  Musculoskeletal:        General: Normal range of motion.  Skin:    General: Skin is warm and dry.  Neurological:     Mental Status: He is alert and oriented to person, place, and time.     Gait: Gait is intact.  Psychiatric:        Mood and Affect: Mood and affect normal.     Recent Labs     Component Value Date/Time   NA 137 12/26/2021 1635   K 3.6 12/26/2021 1635   CL 103 12/26/2021 1635   CO2 24 12/26/2021 1635   GLUCOSE 99 12/26/2021 1635   BUN 17 12/26/2021 1635   CREATININE 1.04 12/26/2021 1635   CALCIUM 9.5 12/26/2021 1635   PROT 8.8 (H) 12/26/2021 1635   ALBUMIN 4.6 12/26/2021 1635   AST 18 12/26/2021 1635   ALT 15 12/26/2021 1635   ALKPHOS 59 12/26/2021 1635   BILITOT 1.1 12/26/2021 1635   GFRNONAA >60 12/26/2021 1635   GFRAA >60 10/14/2017 0321    Lab Results  Component Value Date   WBC 6.0 12/26/2021   HGB 16.1 12/26/2021   HCT 46.4 12/26/2021   MCV 85.8 12/26/2021   PLT 206 12/26/2021   Lab Results  Component Value Date   HGBA1C 5.6 08/18/2017    Lab Results  Component Value Date   CHOL 177 08/18/2017   HDL 82 08/18/2017   LDLCALC 80 08/18/2017  TRIG 73 08/18/2017   CHOLHDL 2.2 08/18/2017   Lab Results  Component Value Date   TSH 1.019 08/18/2017      Assessment and Plan:  Severe episode of recurrent major depressive disorder, without psychotic features (HCC) Assessment & Plan: Proceed with therapy as planned for next week.  Patient also plans to drive to the beach today to relax.  We reviewed the importance of physical activity, lowering of sunlight, and adequate sleep.    Patient is not interested in medications at this time, though we briefly reviewed options to include mirtazapine  and trazodone  (which might also help with his sleep issues), SSRI/SNRI, or bupropion (which may help with concentration and focus).   Insomnia, unspecified type Assessment & Plan: Reviewed common practices to improve sleep hygiene including consistent sleep schedule, cool sleeping temperatures, keeping bedroom as dark as possible, daytime physical activity (especially resistance training), avoiding harmful bedtime habits (e.g. TV, phone use, etc),  avoiding oral intake within 1 hour of bed, avoiding evening caffeine/alcohol consumption.  Consider mindfulness exercises for sleep e.g. Calm app, yoga, etc.  Patient is amenable to trial of diphenhydramine as needed.  He was advised to obtain this OTC.   Attention deficit hyperactivity disorder (ADHD), combined type Assessment & Plan: Stop Adderall as it does not seem to be helping and may be worsening his anxiety and sleep issues.      Follow-up as scheduled 01/29/2024   Rolan Hoyle, PA-C, DMSc, Nutritionist [facility] [phone]

## 2024-01-10 NOTE — Assessment & Plan Note (Signed)
 Proceed with therapy as planned for next week.  Patient also plans to drive to the beach today to relax.  We reviewed the importance of physical activity, lowering of sunlight, and adequate sleep.    Patient is not interested in medications at this time, though we briefly reviewed options to include mirtazapine  and trazodone  (which might also help with his sleep issues), SSRI/SNRI, or bupropion (which may help with concentration and focus).

## 2024-01-10 NOTE — Assessment & Plan Note (Addendum)
 Reviewed common practices to improve sleep hygiene including consistent sleep schedule, cool sleeping temperatures, keeping bedroom as dark as possible, daytime physical activity (especially resistance training), avoiding harmful bedtime habits (e.g. TV, phone use, etc),  avoiding oral intake within 1 hour of bed, avoiding evening caffeine/alcohol consumption.  Consider mindfulness exercises for sleep e.g. Calm app, yoga, etc.  Patient is amenable to trial of diphenhydramine as needed.  He was advised to obtain this OTC.

## 2024-01-15 ENCOUNTER — Ambulatory Visit: Attending: Physician Assistant

## 2024-01-15 DIAGNOSIS — M79631 Pain in right forearm: Secondary | ICD-10-CM | POA: Diagnosis not present

## 2024-01-15 DIAGNOSIS — M6281 Muscle weakness (generalized): Secondary | ICD-10-CM | POA: Diagnosis not present

## 2024-01-15 DIAGNOSIS — R278 Other lack of coordination: Secondary | ICD-10-CM | POA: Diagnosis not present

## 2024-01-15 DIAGNOSIS — R29898 Other symptoms and signs involving the musculoskeletal system: Secondary | ICD-10-CM | POA: Diagnosis not present

## 2024-01-15 NOTE — Therapy (Unsigned)
 OUTPATIENT OCCUPATIONAL THERAPY ORTHO EVALUATION  Patient Name: Keith Dudley MRN: [id number] DOB:Nov 14, 1995, 28 y.o., male Today's Date: 01/15/2024  PCP: Toribio Hoyle, PA REFERRING PROVIDER: PCP  END OF SESSION:  OT End of Session - 01/15/24 1110     Visit Number 1    Number of Visits 12    Date for Recertification  04/08/24    Progress Note Due on Visit 10    OT Start Time 1015    OT Stop Time 1105    OT Time Calculation (min) 50 min    Activity Tolerance Patient tolerated treatment well    Behavior During Therapy [facility] for tasks assessed/performed         Past Medical History:  Diagnosis Date   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   Alcoholic gastritis with bleeding    Benzodiazepine overdose 08/16/2017   Tonsillitis 12/02/2021   Past Surgical History:  Procedure Laterality Date   DENTAL RESTORATION/EXTRACTION WITH X-RAY     OTHER SURGICAL HISTORY Right 11/2023   Arm surgery-forearm   TONSILLECTOMY Bilateral 12/23/2021   Procedure: TONSILLECTOMY;  Surgeon: Edda Mt, MD;  Location: [facility];  Service: ENT;  Laterality: Bilateral;   Patient Active Problem List   Diagnosis Date Noted   History of suicide attempt 01/10/2024   Insomnia 01/10/2024   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   Attention deficit hyperactivity disorder (ADHD), combined type 01/01/2024   Allergic rhinitis 09/04/2023   Fear of needles 09/04/2023   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 Pt accompanied by: self  PERTINENT HISTORY: [facility]  Chief Orthopedic Oncology Complaint: Right forearm mass  - right forearm mass resection @ OSH 10/10/23. Path = granular cell tumor with high mitoses and positive margins (but did not meet criteria for malignancy)  - Tumor bed resection of right dorsal forearm granular cell tumor ([facility], 12-01-2023) Path = Residual granular cell tumor. Neg margins  2 wk post op appt., 12-20-2023: Jewel returns for his scheduled 2 week post op appt, in order to evaluate incision, function/ROM, and to review pathology and anticipated future follow-up. He is accompanied by his girlfriend, [place]. He feels he is doing well and denies any fevers, chills, or problems with the incision (no drainage). His pain is controlled without any meds. He has been experiencing sweats in the middle of the night at times, but denies any fevers (he also experienced this waking at night, feeling sweaty after the first resection).  PRECAUTIONS: None  RED FLAGS: None   WEIGHT BEARING RESTRICTIONS: No  PAIN:  Are you having pain? Yes: NPRS scale: 10/10 with activity  Pain location: R volar wrist and dorsal forearm, pain radiates to hand Pain description: shooting, throbbing, radiating  Aggravating factors: activity Relieving factors: rest, ice  LIVING ENVIRONMENT: Lives with: lives alone Lives in: ground level apt Stairs: No Has following equipment at home:  None  PLOF: Independent, works full time in medical supplies.  Works out 3 days a week. Currently off work until 12/15, hoping to delay until 02/12/24 as work requires repetition with lifting, including lifting items from 0-15 lbs repeatedly within a 12 hr shift.   PATIENT GOALS: Get more strength in my arm and understand why  it's so painful.   NEXT MD VISIT: Dec 10th to surgeon.   OBJECTIVE:  Note: Objective measures were completed at Evaluation unless otherwise noted.  HAND DOMINANCE: Right  ADLs: Overall ADLs: eating with non-dominant hand d/t pain in the R, some difficulty with buttons on a shirt, limited gripping in the R hand d/t pain, numbness, and weakness.   IADLs: Avoiding use of R hand on the steering wheel d/t pain with sustained gripping.  Pt reports that he avoids use of his R arm for cleaning/cooking/laundry tasks d/t pain and weakness in the wrist and hand.   FUNCTIONAL OUTCOME MEASURES: MAM-20 for musculoskeletal conditions: TBD   UPPER EXTREMITY ROM:  ROM in digits 1-5 in R/L all WNL  Active ROM Right eval Left eval  Shoulder flexion    Shoulder abduction    Shoulder adduction    Shoulder extension    Shoulder internal rotation    Shoulder external rotation    Elbow flexion    Elbow extension    Wrist flexion 90 95  Wrist extension 68 75  Wrist ulnar deviation    Wrist radial deviation    Wrist pronation WNL WNL  Wrist supination WNL WNL  (Blank rows = not tested)  UPPER EXTREMITY MMT:     MMT Right eval Left Eval   Shoulder flexion    Shoulder abduction    Shoulder adduction    Shoulder extension    Shoulder internal rotation    Shoulder external rotation    Middle trapezius    Lower trapezius    Elbow flexion 4+ 5  Elbow extension 4+ 5  Wrist flexion 4- 5  Wrist extension 4- 5  Wrist ulnar deviation 4- 5  Wrist radial deviation 4- 5  Wrist pronation 4- 5  Wrist supination 4- 5  (Blank rows = not tested)  HAND FUNCTION: Grip strength: Right: 40 lbs; Left: 94 lbs, Lateral pinch: Right: 6 lbs, Left: 20 lbs, and 3 point pinch: Right: 4 lbs, Left: 16 lbs  COORDINATION: 9 Hole Peg test: Right: 21 sec; Left: 26 sec  SENSATION: Pt reports numbness on volar wrist, extending to volar and dorsal forearm ulnar aspect   EDEMA: No visible edema    COGNITION: Overall cognitive status: Within functional limits for tasks assessed  OBSERVATIONS:  5.5 cm post surgical scar ulnar side of dorsal forearm full healed; ends of scar slightly raised.    TREATMENT DATE: 01/15/24 Evaluation completed.  Self Care: -Condition management education: initiated educ on scar massage, scar desensitization      -Pt inquired about return to weight lifting: advised pt to follow up with surgeon on lifting restrictions; post op orders currently state no weight lifting. OT advised on risks of dropping heavy weights with decreased grip strength and high pain levels with activity.  Pt verbalized understanding.  Therapeutic Exercise: -Issued green theraputty and instructed pt in strengthening exercises for R hand, including gross grasping, and lateral/2 point/3 point pinching.  Encouraged completion 5-10 min, 1-3x per day.  Able to return demo with min vc and visual handout.   PATIENT EDUCATION: Education details: OT role, goals, poc, HEP, condition management Person educated: Patient Education method: Explanation, Verbal cues, and Handouts Education comprehension: verbalized understanding  HOME EXERCISE PROGRAM: Green theraputty  GOALS: Goals reviewed with patient? Yes  SHORT TERM GOALS: Target date: 02/05/24  Pt will be indep to perform HEP for improving R hand strength for daily tasks.  Baseline: Eval: Green putty issued Goal status: INITIAL  2.  Pt will be indep to demonstrate scar massage technique to flatten, desensitize, and reduce risk of scar adhesions.  Baseline: Eval: Education initiated at eval Goal status: INITIAL  LONG TERM GOALS: Target date: 02/26/24  Pt will increase R grip strength by 20 lbs or more in order to securely hold and stability heavy ADL supplies in R dominant hand.  Baseline: Eval: R 40 lbs, L 94 lbs  (non-dominant) Goal status: INITIAL  2.  Pt will tolerate manual therapy, therapeutic modalities, and exercises to decrease pain in distal RUE to a reported 5/10 pain or less with activity.  Baseline: Eval: Can reach 10/10 with activity  Goal status: INITIAL  3.  Pt will increase distal RUE strength by 1/2 muscle grade or more in order to improve tolerance for lifting items up to 15 lbs for return to work.  Baseline: R wrist/forearm strength grossly 4-/5 Goal status: INITIAL  4.  Pt will increase R pinch strength to enable indep with opening drinks and food packages Baseline: Eval: Lateral R 6 lbs, L 20 lbs; 3 point pinch R 4 lbs, L 16 lbs Goal status: INITIAL  5.  Pt will increase MAM-20 score by (TBD) or more points to demonstrate improved functional use of the R dominant hand for daily tasks.  Baseline: Eval: TBD Goal status: INITIAL  ASSESSMENT:  CLINICAL IMPRESSION: Patient is a 28 y.o. male who was seen today for occupational therapy evaluation for functional decline following R forearm tumor resection.  Pt presents with significant weakness in R dominant wrist and hand, severe pain levels in R hand/wrist/forearm with activity, and decreased sensation in these areas, contributing to limited engagement of the dominant hand into daily tasks.  Prior to sx, pt was working out 3 days a week, including lifting weights, and working full time.  Pt is motivated to increase distal RUE strength, decrease pain, and work towards return to PLOF.  Pt will benefit from skilled OT for condition management education, HEP instruction, and to provide modalities for pain management as needed in order to work towards above stated goals.  Pt in agreement with poc.   PERFORMANCE DEFICITS: in functional skills including ADLs, IADLs, sensation, ROM, strength, pain, fascial restrictions, flexibility, body mechanics, decreased knowledge of precautions, decreased knowledge of use of DME, wound, skin integrity, and  UE functional use, cognitive skills including attention, and psychosocial skills including coping strategies, habits, and routines and behaviors.   IMPAIRMENTS: are limiting patient from ADLs, IADLs, work, and leisure.   COMORBIDITIES: has co-morbidities such as ADHD, depression that affects occupational performance. Patient will benefit from skilled OT to address above impairments and improve overall function.  MODIFICATION OR ASSISTANCE TO COMPLETE EVALUATION: No modification of tasks or assist necessary to complete an evaluation.  OT OCCUPATIONAL PROFILE AND HISTORY: Detailed assessment: Review of records and  additional review of physical, cognitive, psychosocial history related to current functional performance.  CLINICAL DECISION MAKING: Moderate - several treatment options, min-mod task modification necessary  REHAB POTENTIAL: Good  EVALUATION COMPLEXITY: Low    PLAN:  OT FREQUENCY: 1-2x/week  OT DURATION: up to 12 weeks (anticipate 6-8)  PLANNED INTERVENTIONS: 02831 OT Re-evaluation, 97535 self care/ADL training, 02889 therapeutic exercise, 97530 therapeutic activity, 97112 neuromuscular re-education, 97140 manual therapy, 97018 paraffin, 02989 moist heat, 97010 cryotherapy, 97034 contrast bath, 97750 Physical Performance Testing, scar mobilization, passive range of motion, psychosocial skills training, coping strategies training, patient/family education, and DME and/or AE instructions  RECOMMENDED OTHER SERVICES: None at this time   CONSULTED AND AGREED WITH PLAN OF CARE: Patient  PLAN FOR NEXT SESSION: MAM-20 for orthopedic conditions, HEP progression   Inocente Blazing, MS, OTR/L  Inocente MARLA Blazing, OT 01/15/2024, 11:12 AM

## 2024-01-18 ENCOUNTER — Ambulatory Visit

## 2024-01-18 DIAGNOSIS — R278 Other lack of coordination: Secondary | ICD-10-CM

## 2024-01-18 DIAGNOSIS — R29898 Other symptoms and signs involving the musculoskeletal system: Secondary | ICD-10-CM | POA: Diagnosis not present

## 2024-01-18 DIAGNOSIS — M6281 Muscle weakness (generalized): Secondary | ICD-10-CM

## 2024-01-18 DIAGNOSIS — M79631 Pain in right forearm: Secondary | ICD-10-CM | POA: Diagnosis not present

## 2024-01-21 NOTE — Therapy (Signed)
 OUTPATIENT OCCUPATIONAL THERAPY ORTHO TREATMENT NOTE  Patient Name: Keith Dudley MRN: [id number] DOB:[birth year], 28 y.o., male Today's Date: 01/21/2024  PCP: Toribio Hoyle, PA REFERRING PROVIDER: PCP  END OF SESSION:  OT End of Session - 01/21/24 2013     Visit Number 2    Number of Visits 12    Date for Recertification  04/08/24    Progress Note Due on Visit 10    OT Start Time 1315    OT Stop Time 1400    OT Time Calculation (min) 45 min    Activity Tolerance Patient limited by pain    Behavior During Therapy 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   Alcoholic gastritis with bleeding    Benzodiazepine overdose 08/16/2017   Tonsillitis 12/02/2021   Past Surgical History:  Procedure Laterality Date   DENTAL RESTORATION/EXTRACTION WITH X-RAY     OTHER SURGICAL HISTORY Right 11/2023   Arm surgery-forearm   TONSILLECTOMY Bilateral 12/23/2021   Procedure: TONSILLECTOMY;  Surgeon: Edda Mt, MD;  Location: [facility];  Service: ENT;  Laterality: Bilateral;   Patient Active Problem List   Diagnosis Date Noted   History of suicide attempt 01/10/2024   Insomnia 01/10/2024   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   Attention deficit hyperactivity disorder (ADHD), combined type 01/01/2024   Allergic rhinitis 09/04/2023   Fear of needles 09/04/2023   Granular cell tumor 08/17/2023   Episodic cannabis use 08/19/2017   Tobacco use disorder 08/19/2017   History of alcohol dependence (HCC) 08/17/2017   Recurrent major depression 08/16/2017   ONSET DATE: 10/10/23  REFERRING DIAG:  M70.101 (ICD-10-CM) - Right hand weakness  M79.631 (ICD-10-CM) - Right forearm pain   THERAPY DIAG:  Muscle weakness (generalized)  Other lack of coordination  Pain in right forearm  Rationale for Evaluation and Treatment: Rehabilitation  SUBJECTIVE:    SUBJECTIVE STATEMENT: Pt reports using theraputty at home did cause some pain in the R wrist and hand. Pt accompanied by: self  PERTINENT HISTORY: [facility]  Chief Orthopedic Oncology Complaint: Right forearm mass  - right forearm mass resection @ OSH 10/10/23. Path = granular cell tumor with high mitoses and positive margins (but did not meet criteria for malignancy)  - Tumor bed resection of right dorsal forearm granular cell tumor ([facility], 12-01-2023) Path = Residual granular cell tumor. Neg margins  2 wk post op appt., 12-20-2023: Dvonte returns for his scheduled 2 week post op appt, in order to evaluate incision, function/ROM, and to review pathology and anticipated future follow-up. He is accompanied by his girlfriend, [place]. He feels he is doing well and denies any fevers, chills, or problems with the incision (no drainage). His pain is controlled without any meds. He has been experiencing sweats in the middle of the night at times, but denies any fevers (he also experienced this waking at night, feeling sweaty after the first resection).  PRECAUTIONS: None  RED FLAGS: None   WEIGHT BEARING RESTRICTIONS: No  PAIN: 01/18/24: 3/10 pain in the R forearm and hand, but up to 8/10 pain last night Are you having pain? Yes: NPRS scale: 10/10 with activity  Pain location: R volar wrist and dorsal forearm, pain radiates to hand Pain description: shooting, throbbing, radiating  Aggravating factors: activity Relieving factors: rest, ice  LIVING ENVIRONMENT: Lives with: lives  alone Lives in: ground level apt Stairs: No Has following equipment at home: None  PLOF: Independent, works full time in medical supplies.  Works out 3 days a week. Currently off work until 12/15, hoping to delay until 02/12/24 as work requires repetition with lifting, including lifting items from 0-15 lbs repeatedly within a 12 hr shift.   PATIENT  GOALS: Get more strength in my arm and understand why it's so painful.   NEXT MD VISIT: Dec 10th to surgeon.   OBJECTIVE:  Note: Objective measures were completed at Evaluation unless otherwise noted.  HAND DOMINANCE: Right  ADLs: Overall ADLs: eating with non-dominant hand d/t pain in the R, some difficulty with buttons on a shirt, limited gripping in the R hand d/t pain, numbness, and weakness.   IADLs: Avoiding use of R hand on the steering wheel d/t pain with sustained gripping.  Pt reports that he avoids use of his R arm for cleaning/cooking/laundry tasks d/t pain and weakness in the wrist and hand.   FUNCTIONAL OUTCOME MEASURES: MAM-20 for musculoskeletal conditions: 73/80   UPPER EXTREMITY ROM:  ROM in digits 1-5 in R/L all WNL  Active ROM Right eval Left eval  Shoulder flexion    Shoulder abduction    Shoulder adduction    Shoulder extension    Shoulder internal rotation    Shoulder external rotation    Elbow flexion    Elbow extension    Wrist flexion 90 95  Wrist extension 68 75  Wrist ulnar deviation    Wrist radial deviation    Wrist pronation WNL WNL  Wrist supination WNL WNL  (Blank rows = not tested)  UPPER EXTREMITY MMT:     MMT Right eval Left Eval   Shoulder flexion    Shoulder abduction    Shoulder adduction    Shoulder extension    Shoulder internal rotation    Shoulder external rotation    Middle trapezius    Lower trapezius    Elbow flexion 4+ 5  Elbow extension 4+ 5  Wrist flexion 4- 5  Wrist extension 4- 5  Wrist ulnar deviation 4- 5  Wrist radial deviation 4- 5  Wrist pronation 4- 5  Wrist supination 4- 5  (Blank rows = not tested)  HAND FUNCTION: Grip strength: Right: 40 lbs; Left: 94 lbs, Lateral pinch: Right: 6 lbs, Left: 20 lbs, and 3 point pinch: Right: 4 lbs, Left: 16 lbs  COORDINATION: 9 Hole Peg test: Right: 21 sec; Left: 26 sec  SENSATION: Pt reports numbness on volar wrist, extending to volar and dorsal forearm  ulnar aspect   EDEMA: No visible edema   COGNITION: Overall cognitive status: Within functional limits for tasks assessed  OBSERVATIONS:  5.5 cm post surgical scar ulnar side of dorsal forearm full healed; ends of scar slightly raised.    TREATMENT DATE: 01/18/24 Moist heat applied to R wrist and forearm for pain reduction/muscle relaxation, used intermittently throughout session during activities noted below.    Self Care: -Completed self efficacy ratings of ADL performance with MAM-20 (see above)         -Recommended downgrading theraputty to increase tolerance: work with smaller ball of putty at one time, minimize grip/pinch force to reduce pain with activity, and decrease time spent using putty               -Education provided on scar management and scar massage technique: hydrate scar, use sunscreen or cover scar in the sun, perform scar massage to  reduce sensitivity, help to flatten scar, and to increase tissue extensibility on and around scar tissue.  Manual Therapy: -Completed scar massage to R forearm; limited tolerance with skin rolling and circular motions.   -Completed STM to dorsal forearm and volar wrist, working to increase circulation and increase tissue extensibility in tight/tender areas                                                                     Therapeutic Exercise: -Instructed in/completed R wrist strengthening with green theraband: Wrist flex, ext, RD/UD, forearm pron/sup x 2 sets 10 reps each; mod vc for form, band positioning using L hand to anchor 1 end, and for making tension adjustments with each exercise for improved tolerance.  Downgraded to isometric strengthening for R wrist flexion and RD d/t poor tolerance to theraband; mod vc for technique and adjusting pressure during isometric hold to improve tolerance.  Issued written handout and reviewed for above noted exercises.  PATIENT EDUCATION: Education details: HEP progression Person educated:  Patient Education method: Explanation, Demonstration, Tactile cues, Verbal cues, and Handouts Education comprehension: verbalized understanding  HOME EXERCISE PROGRAM: Green theraputty, green theraband, isometric wrist strengthening   GOALS: Goals reviewed with patient? Yes  SHORT TERM GOALS: Target date: 02/05/24  Pt will be indep to perform HEP for improving R hand strength for daily tasks.  Baseline: Eval: Green putty issued Goal status: INITIAL  2.  Pt will be indep to demonstrate scar massage technique to flatten, desensitize, and reduce risk of scar adhesions.  Baseline: Eval: Education initiated at eval Goal status: INITIAL  LONG TERM GOALS: Target date: 02/26/24  Pt will increase R grip strength by 20 lbs or more in order to securely hold and stability heavy ADL supplies in R dominant hand.  Baseline: Eval: R 40 lbs, L 94 lbs (non-dominant) Goal status: INITIAL  2.  Pt will tolerate manual therapy, therapeutic modalities, and exercises to decrease pain in distal RUE to a reported 5/10 pain or less with activity.  Baseline: Eval: Can reach 10/10 with activity  Goal status: INITIAL  3.  Pt will increase distal RUE strength by 1/2 muscle grade or more in order to improve tolerance for lifting items up to 15 lbs for return to work.  Baseline: R wrist/forearm strength grossly 4-/5 Goal status: INITIAL  4.  Pt will increase R pinch strength to enable indep with opening drinks and food packages Baseline: Eval: Lateral R 6 lbs, L 20 lbs; 3 point pinch R 4 lbs, L 16 lbs Goal status: INITIAL  5.  Pt will increase MAM-20 score by (TBD) or more points to demonstrate improved functional use of the R dominant hand for daily tasks.  Baseline: Eval: TBD Goal status: INITIAL  ASSESSMENT:  CLINICAL IMPRESSION: Pt receptive to HEP modifications for theraputty, and will plan to implement.  Fair tolerance to wrist strengthening exercises this date, requiring downgrading to isometric  contractions for wrist flexion and RD.  With isometrics, only very light contraction tolerated in above noted movement planes.  Pt encouraged to continue to modify HEP frequency and resistance levels as needed in order to perform exercises without significant increases in pain.  Pt verbalized understanding.  Pt will continue to benefit from skilled OT for  condition management education, HEP progression, and to provide modalities for pain management as needed in order to work towards above stated goals.    PERFORMANCE DEFICITS: in functional skills including ADLs, IADLs, sensation, ROM, strength, pain, fascial restrictions, flexibility, body mechanics, decreased knowledge of precautions, decreased knowledge of use of DME, wound, skin integrity, and UE functional use, cognitive skills including attention, and psychosocial skills including coping strategies, habits, and routines and behaviors.   IMPAIRMENTS: are limiting patient from ADLs, IADLs, work, and leisure.   COMORBIDITIES: has co-morbidities such as ADHD, depression that affects occupational performance. Patient will benefit from skilled OT to address above impairments and improve overall function.  MODIFICATION OR ASSISTANCE TO COMPLETE EVALUATION: No modification of tasks or assist necessary to complete an evaluation.  OT OCCUPATIONAL PROFILE AND HISTORY: Detailed assessment: Review of records and additional review of physical, cognitive, psychosocial history related to current functional performance.  CLINICAL DECISION MAKING: Moderate - several treatment options, min-mod task modification necessary  REHAB POTENTIAL: Good  EVALUATION COMPLEXITY: Low    PLAN:  OT FREQUENCY: 1-2x/week  OT DURATION: up to 12 weeks (anticipate 6-8)  PLANNED INTERVENTIONS: 02831 OT Re-evaluation, 97535 self care/ADL training, 02889 therapeutic exercise, 97530 therapeutic activity, 97112 neuromuscular re-education, 97140 manual therapy, 97018 paraffin,  02989 moist heat, 97010 cryotherapy, 97034 contrast bath, 97750 Physical Performance Testing, scar mobilization, passive range of motion, psychosocial skills training, coping strategies training, patient/family education, and DME and/or AE instructions  RECOMMENDED OTHER SERVICES: None at this time   CONSULTED AND AGREED WITH PLAN OF CARE: Patient  PLAN FOR NEXT SESSION: HEP progression  Inocente Blazing, MS, OTR/L  Inocente MARLA Blazing, OT 01/21/2024, 8:16 PM

## 2024-01-22 ENCOUNTER — Ambulatory Visit

## 2024-01-22 DIAGNOSIS — R29898 Other symptoms and signs involving the musculoskeletal system: Secondary | ICD-10-CM | POA: Diagnosis not present

## 2024-01-22 DIAGNOSIS — R278 Other lack of coordination: Secondary | ICD-10-CM

## 2024-01-22 DIAGNOSIS — M79631 Pain in right forearm: Secondary | ICD-10-CM

## 2024-01-22 DIAGNOSIS — M6281 Muscle weakness (generalized): Secondary | ICD-10-CM

## 2024-01-23 NOTE — Therapy (Signed)
 OUTPATIENT OCCUPATIONAL THERAPY ORTHO TREATMENT NOTE  Patient Name: Keith Dudley MRN: [id number] DOB:1995-07-18, 28 y.o., male Today's Date: 01/23/2024  PCP: Keith Hoyle, PA REFERRING PROVIDER: PCP  END OF SESSION:  OT End of Session - 01/22/24       Visit Number 3    Number of Visits 12     Date for Recertification  04/08/24     Progress Note Due on Visit 10     OT Start Time 1100a    OT Stop Time 1145a    OT Time Calculation (min) 45 min     Activity Tolerance Patient limited by pain     Behavior During Therapy [facility] for tasks assessed/performed     Past Medical History:  Diagnosis Date   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   Alcoholic gastritis with bleeding    Benzodiazepine overdose 08/16/2017   Tonsillitis 12/02/2021   Past Surgical History:  Procedure Laterality Date   DENTAL RESTORATION/EXTRACTION WITH X-RAY     OTHER SURGICAL HISTORY Right 11/2023   Arm surgery-forearm   TONSILLECTOMY Bilateral 12/23/2021   Procedure: TONSILLECTOMY;  Surgeon: Edda Mt, MD;  Location: [facility];  Service: ENT;  Laterality: Bilateral;   Patient Active Problem List   Diagnosis Date Noted   History of suicide attempt 01/10/2024   Insomnia 01/10/2024   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   Attention deficit hyperactivity disorder (ADHD), combined type 01/01/2024   Allergic rhinitis 09/04/2023   Fear of needles 09/04/2023   Granular cell tumor 08/17/2023   Episodic cannabis use 08/19/2017   Tobacco use disorder 08/19/2017   History of alcohol dependence (HCC) 08/17/2017   Recurrent major depression 08/16/2017   ONSET DATE: 10/10/23  REFERRING DIAG:  M70.101 (ICD-10-CM) - Right hand weakness  M79.631 (ICD-10-CM) - Right forearm pain   THERAPY DIAG:  Muscle weakness (generalized)  Other lack of coordination  Pain in right forearm  Rationale for Evaluation and Treatment: Rehabilitation  SUBJECTIVE:    SUBJECTIVE STATEMENT: Pt reports having consistent pain with light resistance exercises. Pt accompanied by: self  PERTINENT HISTORY: [facility]  Chief Orthopedic Oncology Complaint: Right forearm mass  - right forearm mass resection @ OSH 10/10/23. Path = granular cell tumor with high mitoses and positive margins (but did not meet criteria for malignancy)  - Tumor bed resection of right dorsal forearm granular cell tumor ([facility], 12-01-2023) Path = Residual granular cell tumor. Neg margins  2 wk post op appt., 12-20-2023: [name] for his scheduled 2 week post op appt, in order to evaluate incision, function/ROM, and to review pathology and anticipated future follow-up. He is accompanied by his girlfriend, [place]. He feels he is doing well and denies any fevers, chills, or problems with the incision (no drainage). His pain is controlled without any meds. He has been experiencing sweats in the middle of the night at times, but denies any fevers (he also experienced this waking at night, feeling sweaty after the first resection).  PRECAUTIONS: None  RED FLAGS: None   WEIGHT BEARING RESTRICTIONS: No  PAIN: 01/22/24: 4/10 pain in the R forearm and hand, reaching up to 8/10 pain with activity  Are you having pain? Yes: NPRS scale: 10/10 with activity  Pain location: R volar wrist and dorsal forearm, pain radiates to hand Pain description: shooting, throbbing, radiating  Aggravating factors: activity Relieving factors: rest, ice  LIVING ENVIRONMENT: Lives with: lives alone Lives in: ground  level apt Stairs: No Has following equipment at home: None  PLOF: Independent, works full time in medical supplies.  Works out 3 days a week. Currently off work until 12/15, hoping to delay until 02/12/24 as work requires repetition with lifting, including lifting items from 0-15 lbs repeatedly within a 12 hr shift.   PATIENT GOALS:  Get more strength in my arm and understand why it's so painful.   NEXT MD VISIT: Dec 10th to surgeon.   OBJECTIVE:  Note: Objective measures were completed at Evaluation unless otherwise noted.  HAND DOMINANCE: Right  ADLs: Overall ADLs: eating with non-dominant hand d/t pain in the R, some difficulty with buttons on a shirt, limited gripping in the R hand d/t pain, numbness, and weakness.   IADLs: Avoiding use of R hand on the steering wheel d/t pain with sustained gripping.  Pt reports that he avoids use of his R arm for cleaning/cooking/laundry tasks d/t pain and weakness in the wrist and hand.   FUNCTIONAL OUTCOME MEASURES: MAM-20 for musculoskeletal conditions: 73/80   UPPER EXTREMITY ROM:  ROM in digits 1-5 in R/L all WNL  Active ROM Right eval Left eval  Shoulder flexion    Shoulder abduction    Shoulder adduction    Shoulder extension    Shoulder internal rotation    Shoulder external rotation    Elbow flexion    Elbow extension    Wrist flexion 90 95  Wrist extension 68 75  Wrist ulnar deviation    Wrist radial deviation    Wrist pronation WNL WNL  Wrist supination WNL WNL  (Blank rows = not tested)  UPPER EXTREMITY MMT:     MMT Right eval Left Eval   Shoulder flexion    Shoulder abduction    Shoulder adduction    Shoulder extension    Shoulder internal rotation    Shoulder external rotation    Middle trapezius    Lower trapezius    Elbow flexion 4+ 5  Elbow extension 4+ 5  Wrist flexion 4- 5  Wrist extension 4- 5  Wrist ulnar deviation 4- 5  Wrist radial deviation 4- 5  Wrist pronation 4- 5  Wrist supination 4- 5  (Blank rows = not tested)  HAND FUNCTION: Grip strength: Right: 40 lbs; Left: 94 lbs, Lateral pinch: Right: 6 lbs, Left: 20 lbs, and 3 point pinch: Right: 4 lbs, Left: 16 lbs  COORDINATION: 9 Hole Peg test: Right: 21 sec; Left: 26 sec  SENSATION: Pt reports numbness on volar wrist, extending to volar and dorsal forearm ulnar  aspect   EDEMA: No visible edema   COGNITION: Overall cognitive status: Within functional limits for tasks assessed  OBSERVATIONS:  5.5 cm post surgical scar ulnar side of dorsal forearm full healed; ends of scar slightly raised.    TREATMENT DATE: 01/22/24 Moist heat applied to R wrist and forearm for pain reduction/muscle relaxation, used intermittently throughout session during activities noted below.    Self Care:      -Continued review for downgrading of theraputty to increase tolerance: (issued yellow) work with smaller ball of putty at one time, minimize grip/pinch force to reduce pain with activity, and decrease time spent using putty               -Review of possible contributors to pain, including muscle tension in flexors and extensors of the wrist and forearm, and potential nerve trauma common with surgery.   -Advised on benefits of ice massage to volar wrist/forearm/hand  for pain management; no more than 3-4 min needed and pt to apply pressure to tolerance.  May also alternate with heat or cold packs x20 min for pain management   Manual Therapy: -Completed scar massage to R forearm; improved tolerance with skin rolling and circular motions.   -Completed STM to dorsal and volar forearm and volar wrist, working to increase circulation and increase tissue extensibility in tight/tender areas.  Promoted deeper STM with use of Stickon bar, performing short strokes distal.proximal at the volar wrist, short strokes medial>lateral at thenar and hypothenar eminences, and long strokes distal>proximal on the volar forearm d/t tight flexor tendons.       -Ice massage x4 min to volar hand, wrist, and forearm for pain/edema reduction                                                              Therapeutic Exercise: -Performed gentle passive wrist mobility all planes with light traction during all planes of movement, working to increase joint space and promote nerve gliding into the hand.    -Recommendation to d/c all resistive wrist strengthening at this time d/t poor tolerance with reported pain increases to 8/10 -Reviewed benefits of completing AROM to tolerance/within pain free ranges.  -Instructed in AROM for wrist strengthening, including writing alphabet with hand (resistance-free); able to return demo with min vc to isolate wrist.    PATIENT EDUCATION: Education details: HEP changes  Person educated: Patient Education method: Explanation, Demonstration, Tactile cues, and Verbal cues Education comprehension: verbalized understanding, returned demonstration, verbal cues required, and needs further education  HOME EXERCISE PROGRAM: Green theraputty, green theraband, isometric wrist strengthening   GOALS: Goals reviewed with patient? Yes  SHORT TERM GOALS: Target date: 02/05/24  Pt will be indep to perform HEP for improving R hand strength for daily tasks.  Baseline: Eval: Green putty issued Goal status: INITIAL  2.  Pt will be indep to demonstrate scar massage technique to flatten, desensitize, and reduce risk of scar adhesions.  Baseline: Eval: Education initiated at eval Goal status: INITIAL  LONG TERM GOALS: Target date: 02/26/24  Pt will increase R grip strength by 20 lbs or more in order to securely hold and stability heavy ADL supplies in R dominant hand.  Baseline: Eval: R 40 lbs, L 94 lbs (non-dominant) Goal status: INITIAL  2.  Pt will tolerate manual therapy, therapeutic modalities, and exercises to decrease pain in distal RUE to a reported 5/10 pain or less with activity.  Baseline: Eval: Can reach 10/10 with activity  Goal status: INITIAL  3.  Pt will increase distal RUE strength by 1/2 muscle grade or more in order to improve tolerance for lifting items up to 15 lbs for return to work.  Baseline: R wrist/forearm strength grossly 4-/5 Goal status: INITIAL  4.  Pt will increase R pinch strength to enable indep with opening drinks and food  packages Baseline: Eval: Lateral R 6 lbs, L 20 lbs; 3 point pinch R 4 lbs, L 16 lbs Goal status: INITIAL  5.  Pt will increase MAM-20 score by (TBD) or more points to demonstrate improved functional use of the R dominant hand for daily tasks.  Baseline: Eval: TBD Goal status: INITIAL  ASSESSMENT:  CLINICAL IMPRESSION: Pt reporting severe pain levels with light  resistance wrist exercises, as well as firm theraputty.  Downgraded to soft putty and recommended pt d/c resistive wrist exercises for now.  Initiated environmental education officer with R hand without resistance; pt able to complete 30% of alphabet before reporting discomfort.  Pt continues to report pain along ulnar nerve distribution from the elbow to the hand.  Limited tolerance to gentle passive wrist mobility in all directions with OT providing light joint distraction to promote gliding of peripheral nerves through the hand.  Fair tolerance to soft tissue massage completed today, with focus on palm, volar wrist, and volar forearm d/t tight wrist and digit flexors and reported pain at the ulnar wrist.  Pt verbalized understanding of HEP recommendations and pain management strategies.  Pt will continue to benefit from skilled OT for condition management education, HEP progression, and to provide modalities for pain management as needed in order to work towards above stated goals.    PERFORMANCE DEFICITS: in functional skills including ADLs, IADLs, sensation, ROM, strength, pain, fascial restrictions, flexibility, body mechanics, decreased knowledge of precautions, decreased knowledge of use of DME, wound, skin integrity, and UE functional use, cognitive skills including attention, and psychosocial skills including coping strategies, habits, and routines and behaviors.   IMPAIRMENTS: are limiting patient from ADLs, IADLs, work, and leisure.   COMORBIDITIES: has co-morbidities such as ADHD, depression that affects occupational performance. Patient  will benefit from skilled OT to address above impairments and improve overall function.  MODIFICATION OR ASSISTANCE TO COMPLETE EVALUATION: No modification of tasks or assist necessary to complete an evaluation.  OT OCCUPATIONAL PROFILE AND HISTORY: Detailed assessment: Review of records and additional review of physical, cognitive, psychosocial history related to current functional performance.  CLINICAL DECISION MAKING: Moderate - several treatment options, min-mod task modification necessary  REHAB POTENTIAL: Good  EVALUATION COMPLEXITY: Low    PLAN:  OT FREQUENCY: 1-2x/week  OT DURATION: up to 12 weeks (anticipate 6-8)  PLANNED INTERVENTIONS: 02831 OT Re-evaluation, 97535 self care/ADL training, 02889 therapeutic exercise, 97530 therapeutic activity, 97112 neuromuscular re-education, 97140 manual therapy, 97018 paraffin, 02989 moist heat, 97010 cryotherapy, 97034 contrast bath, 97750 Physical Performance Testing, scar mobilization, passive range of motion, psychosocial skills training, coping strategies training, patient/family education, and DME and/or AE instructions  RECOMMENDED OTHER SERVICES: None at this time   CONSULTED AND AGREED WITH PLAN OF CARE: Patient  PLAN FOR NEXT SESSION: HEP progression  Inocente Blazing, MS, OTR/L  Inocente MARLA Blazing, OT 01/23/2024, 9:30 PM

## 2024-01-24 ENCOUNTER — Ambulatory Visit

## 2024-01-24 DIAGNOSIS — D219 Benign neoplasm of connective and other soft tissue, unspecified: Secondary | ICD-10-CM | POA: Diagnosis not present

## 2024-01-25 DIAGNOSIS — F331 Major depressive disorder, recurrent, moderate: Secondary | ICD-10-CM | POA: Diagnosis not present

## 2024-01-29 ENCOUNTER — Encounter: Payer: Self-pay | Admitting: Physician Assistant

## 2024-01-29 ENCOUNTER — Ambulatory Visit

## 2024-01-29 ENCOUNTER — Ambulatory Visit: Admitting: Physician Assistant

## 2024-01-29 VITALS — BP 122/86 | HR 87 | Temp 98.1°F | Ht 71.0 in | Wt 197.0 lb

## 2024-01-29 DIAGNOSIS — G5621 Lesion of ulnar nerve, right upper limb: Secondary | ICD-10-CM | POA: Insufficient documentation

## 2024-01-29 NOTE — Assessment & Plan Note (Signed)
 Try topical diclofenac and nocturnal wrist splint. Return in 2 weeks. Will send to neuro if not improving.

## 2024-01-29 NOTE — Progress Notes (Signed)
 "   Date:  01/29/2024   Name:  Keith Dudley   DOB:  19-Dec-1995   MRN:  [id number]   Chief Complaint: nerve damage  (Wants EMG, since surgery, numbness and tingling )  HPI  Keith Dudley returns to clinic today primarily to discuss his right hand/forearm issues following surgical removal of a granular cell tumor 10/10/23 followed by tumor bed resection 12/01/23. He complains of pain in the medial hand and distal medial forearm when he uses it for about 5 min or more, for example when cutting vegetables. Also locking of the 4th and 5th fingers. He continues to work with occupational therapy. Surgeon insists this is not related to the procedure, as she was working at a different site not near the ulnar nerve. Keith Dudley wonders if an EMG would be useful.   We have also been discussing anxiety and depression, though he does not desire any medication for this.    Medication list has been reviewed and updated.  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   Episodic cannabis use 08/19/2017   Tobacco use disorder 08/19/2017   History of alcohol dependence (HCC) 08/17/2017   Recurrent major depression 08/16/2017    Allergies[2]  Immunization History  Administered Date(s) Administered   DTP 09/23/1994, 12/02/1994, 01/27/1995, 09/15/1995   DTaP 09/24/1999   DTaP / IPV 05/13/2012   HIB, Unspecified 09/23/1994, 12/02/1994, 01/27/1995, 09/15/1995   Hepatitis B, ADULT 30-Dec-1994, 09/23/1994, 01/27/1995   IPV 09/24/1999   MMR 09/15/1995, 09/24/1999   OPV 12/02/1994, 01/27/1995, 07/11/1995, 09/15/1995   Varicella 07/15/1998    Past Surgical  History:  Procedure Laterality Date   DENTAL RESTORATION/EXTRACTION WITH X-RAY     OTHER SURGICAL HISTORY Right 11/2023   Arm surgery-forearm   TONSILLECTOMY Bilateral 12/23/2021   Procedure: TONSILLECTOMY;  Surgeon: Edda Mt, MD;  Location: [facility];  Service: ENT;  Laterality: Bilateral;    Social History[3]  Family History  Problem Relation Age of Onset   Breast cancer Mother    Diabetes Paternal Grandmother         01/29/2024    3:44 PM 01/10/2024    8:26 AM 10/26/2023    2:27 PM 09/04/2023    2:17 PM  GAD 7 : Generalized Anxiety Score  Nervous, Anxious, on Edge 0 3 0 1  Control/stop worrying 3 2 0 0  Worry too much - different things 3 2 0 1  Trouble relaxing 1 2 0 1  Restless 2 2 0 1  Easily annoyed or irritable 2 2 0 1  Afraid - awful might happen 3 2 0 0  Total GAD 7 Score 14 15 0 5  Anxiety Difficulty Somewhat difficult Somewhat difficult Not difficult at all Not difficult at all       01/29/2024    3:44 PM 01/10/2024    8:19 AM 01/01/2024    3:21 PM  Depression screen PHQ 2/9  Decreased Interest 2 2 3   Down, Depressed, Hopeless 2 3 3   PHQ - 2 Score 4 5 6   Altered sleeping 1 3 2   Tired, decreased energy 2 2 2   Change in appetite 2 3 1  Feeling bad or failure about yourself  2 3 1   Trouble concentrating 2 3 3   Moving slowly or fidgety/restless 2 2 0  Suicidal thoughts 1 1 0  PHQ-9 Score 16 22 15   Difficult doing work/chores Somewhat difficult Not difficult at all Not difficult at all    BP Readings from Last 3 Encounters:  01/29/24 122/86  01/10/24 122/84  10/26/23 102/64    Wt Readings from Last 3 Encounters:  01/29/24 197 lb (89.4 kg)  01/10/24 194 lb (88 kg)  10/26/23 194 lb (88 kg)    BP 122/86   Pulse 87   Temp 98.1 F (36.7 C)   Ht 5' 11 (1.803 m)   Wt 197 lb (89.4 kg)   SpO2 98%   BMI 27.48 kg/m   Physical Exam Vitals and nursing note reviewed.  Constitutional:      Appearance: Normal appearance.   Cardiovascular:     Rate and Rhythm: Normal rate.  Pulmonary:     Effort: Pulmonary effort is normal.  Abdominal:     General: There is no distension.  Musculoskeletal:        General: Normal range of motion.     Comments: Positive Tinel's and Phalen's on right, although with symptoms along the ulnar nerve distribution.   Skin:    General: Skin is warm and dry.  Neurological:     Mental Status: He is alert and oriented to person, place, and time.     Gait: Gait is intact.  Psychiatric:        Mood and Affect: Mood and affect normal.     Recent Labs     Component Value Date/Time   NA 137 12/26/2021 1635   K 3.6 12/26/2021 1635   CL 103 12/26/2021 1635   CO2 24 12/26/2021 1635   GLUCOSE 99 12/26/2021 1635   BUN 17 12/26/2021 1635   CREATININE 1.04 12/26/2021 1635   CALCIUM 9.5 12/26/2021 1635   PROT 8.8 (H) 12/26/2021 1635   ALBUMIN 4.6 12/26/2021 1635   AST 18 12/26/2021 1635   ALT 15 12/26/2021 1635   ALKPHOS 59 12/26/2021 1635   BILITOT 1.1 12/26/2021 1635   GFRNONAA >60 12/26/2021 1635   GFRAA >60 10/14/2017 0321    Lab Results  Component Value Date   WBC 6.0 12/26/2021   HGB 16.1 12/26/2021   HCT 46.4 12/26/2021   MCV 85.8 12/26/2021   PLT 206 12/26/2021   Lab Results  Component Value Date   HGBA1C 5.6 08/18/2017   Lab Results  Component Value Date   CHOL 177 08/18/2017   HDL 82 08/18/2017   LDLCALC 80 08/18/2017   TRIG 73 08/18/2017   CHOLHDL 2.2 08/18/2017   Lab Results  Component Value Date   TSH 1.019 08/18/2017      Assessment and Plan:  Ulnar nerve compression, right Assessment & Plan: Try topical diclofenac and nocturnal wrist splint. Return in 2 weeks. Will send to neuro if not improving.       F/u 2-3 wk OV   Rolan Hoyle, PA-C, DMSc, DipACLM, Nutritionist [facility] [phone]      [1]  Current Meds  Medication Sig   fluticasone   (FLONASE ) 50 MCG/ACT nasal spray Place 2 sprays into both nostrils daily.  [2] No Known Allergies [3]  Social History Tobacco Use   Smoking status: Former    Current packs/day: 0.00    Average packs/day: 0.3  packs/day for 11.5 years (2.9 ttl pk-yrs)    Types: Cigarettes    Start date: 2014    Quit date: 08/08/2023    Years since quitting: 0.4   Smokeless tobacco: Never  Vaping Use   Vaping status: Never Used  Substance Use Topics   Alcohol use: Yes    Comment: rarely. Used to be daily.   Drug use: Yes    Types: Marijuana    Comment: occasionally/socially. Used to be daily.   "

## 2024-02-13 ENCOUNTER — Telehealth: Payer: Self-pay

## 2024-02-13 ENCOUNTER — Ambulatory Visit: Attending: Physician Assistant

## 2024-02-13 NOTE — Telephone Encounter (Signed)
 Notified pt of his 2nd no show for OT. Pt wishes to continue OT and was advised to call and schedule 1 visit at a time per clinic's attendance policy. Provided clinic number for pt to follow up with scheduling at 1x per week.   Inocente Blazing, MS, OTR/L

## 2024-02-16 ENCOUNTER — Ambulatory Visit (INDEPENDENT_AMBULATORY_CARE_PROVIDER_SITE_OTHER): Admitting: Physician Assistant

## 2024-02-16 ENCOUNTER — Encounter: Payer: Self-pay | Admitting: Physician Assistant

## 2024-02-16 VITALS — BP 110/82 | HR 75 | Temp 98.6°F | Ht 71.0 in | Wt 194.0 lb

## 2024-02-16 DIAGNOSIS — G5621 Lesion of ulnar nerve, right upper limb: Secondary | ICD-10-CM

## 2024-02-16 MED ORDER — MELOXICAM 15 MG PO TABS: 15.0000 mg | ORAL_TABLET | Freq: Every day | ORAL | 0 refills | Status: AC

## 2024-02-16 NOTE — Assessment & Plan Note (Signed)
 Will send to neuro. In the meantime, restart oral meloxicam  for a month. Continue topical diclofenac and nocturnal wrist splint.

## 2024-02-16 NOTE — Progress Notes (Signed)
 "   Date:  02/16/2024   Name:  Keith Dudley   DOB:  1995-11-21   MRN:  [id number]   Chief Complaint: Ulnar nerve compression, right (Still hurts, 7 pain scale, using tiger bomb and voltaren gel, helps some )  HPI  Keith Dudley returns for 2-week follow-up on ulnar nerve compression.  Last visit I encouraged him to try nocturnal wrist splint and topical diclofenac. This has resulted in some improvement but he still ahs persistent symptoms and would like to see neurology for EMG and further evaluation.   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 He complains of pain in the medial hand and distal medial forearm when he uses it for about 5 min or more, for example when cutting vegetables. Also locking of the 4th and 5th fingers. He continues to work with occupational therapy. Surgeon insists this is not related to the procedure, as she was working at a different site not near the ulnar nerve. Keith Dudley wonders if an EMG would be useful.    Medication list has been reviewed and updated.  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   Episodic cannabis use 08/19/2017   Tobacco use disorder 08/19/2017   History of alcohol dependence (HCC) 08/17/2017   Recurrent major depression 08/16/2017    Allergies[2]  Immunization History  Administered Date(s) Administered   DTP 09/23/1994, 12/02/1994, 01/27/1995, 09/15/1995   DTaP 09/24/1999   DTaP / IPV 05/13/2012   HIB, Unspecified  09/23/1994, 12/02/1994, 01/27/1995, 09/15/1995   Hepatitis B, ADULT 30-Mar-1994, 09/23/1994, 01/27/1995   IPV 09/24/1999   MMR 09/15/1995, 09/24/1999   OPV 12/02/1994, 01/27/1995, 07/11/1995, 09/15/1995   Varicella 07/15/1998    Past Surgical History:  Procedure Laterality Date   DENTAL RESTORATION/EXTRACTION WITH X-RAY     OTHER SURGICAL HISTORY Right 11/2023   Arm surgery-forearm   TONSILLECTOMY Bilateral 12/23/2021   Procedure: TONSILLECTOMY;  Surgeon: Edda Mt, MD;  Location: [facility];  Service: ENT;  Laterality: Bilateral;    Social History[3]  Family History  Problem Relation Age of Onset   Breast cancer Mother    Diabetes Paternal Grandmother         01/29/2024    3:44 PM 01/10/2024    8:26 AM 10/26/2023    2:27 PM 09/04/2023    2:17 PM  GAD 7 : Generalized Anxiety Score  Nervous, Anxious, on Edge 0 3 0 1  Control/stop worrying 3 2 0 0  Worry too much - different things 3 2 0 1  Trouble relaxing 1 2 0 1  Restless 2 2 0 1  Easily annoyed or irritable 2 2 0 1  Afraid - awful might happen 3 2 0 0  Total GAD 7 Score 14 15 0 5  Anxiety Difficulty Somewhat difficult Somewhat difficult Not difficult at all Not difficult at all       01/29/2024    3:44 PM 01/10/2024    8:19 AM 01/01/2024    3:21 PM  Depression screen PHQ 2/9  Decreased  Interest 2 2 3   Down, Depressed, Hopeless 2 3 3   PHQ - 2 Score 4 5 6   Altered sleeping 1 3 2   Tired, decreased energy 2 2 2   Change in appetite 2 3 1   Feeling bad or failure about yourself  2 3 1   Trouble concentrating 2 3 3   Moving slowly or fidgety/restless 2 2 0  Suicidal thoughts 1 1 0  PHQ-9 Score 16 22 15   Difficult doing work/chores Somewhat difficult Not difficult at all Not difficult at all    BP Readings from Last 3 Encounters:  02/16/24 110/82  01/29/24 122/86  01/10/24 122/84    Wt Readings from Last 3 Encounters:  02/16/24 194 lb (88 kg)  01/29/24 197 lb (89.4 kg)  01/10/24 194 lb (88 kg)     BP 110/82   Pulse 75   Temp 98.6 F (37 C)   Ht 5' 11 (1.803 m)   Wt 194 lb (88 kg)   SpO2 97%   BMI 27.06 kg/m   Physical Exam Vitals and nursing note reviewed.  Constitutional:      Appearance: Normal appearance.  Cardiovascular:     Rate and Rhythm: Normal rate.  Pulmonary:     Effort: Pulmonary effort is normal.  Abdominal:     General: There is no distension.  Musculoskeletal:        General: Normal range of motion.  Skin:    General: Skin is warm and dry.  Neurological:     Mental Status: He is alert and oriented to person, place, and time.     Gait: Gait is intact.  Psychiatric:        Mood and Affect: Mood and affect normal.     Recent Labs     Component Value Date/Time   NA 137 12/26/2021 1635   K 3.6 12/26/2021 1635   CL 103 12/26/2021 1635   CO2 24 12/26/2021 1635   GLUCOSE 99 12/26/2021 1635   BUN 17 12/26/2021 1635   CREATININE 1.04 12/26/2021 1635   CALCIUM 9.5 12/26/2021 1635   PROT 8.8 (H) 12/26/2021 1635   ALBUMIN 4.6 12/26/2021 1635   AST 18 12/26/2021 1635   ALT 15 12/26/2021 1635   ALKPHOS 59 12/26/2021 1635   BILITOT 1.1 12/26/2021 1635   GFRNONAA >60 12/26/2021 1635   GFRAA >60 10/14/2017 0321    Lab Results  Component Value Date   WBC 6.0 12/26/2021   HGB 16.1 12/26/2021   HCT 46.4 12/26/2021   MCV 85.8 12/26/2021   PLT 206 12/26/2021   Lab Results  Component Value Date   HGBA1C 5.6 08/18/2017   Lab Results  Component Value Date   CHOL 177 08/18/2017   HDL 82 08/18/2017   LDLCALC 80 08/18/2017   TRIG 73 08/18/2017   CHOLHDL 2.2 08/18/2017   Lab Results  Component Value Date   TSH 1.019 08/18/2017      Assessment and Plan:  Ulnar nerve compression, right Assessment & Plan: Will send to neuro. In the meantime, restart oral meloxicam  for a month. Continue topical diclofenac and nocturnal wrist splint.  Orders: -     Meloxicam ; Take 1 tablet (15 mg total) by mouth daily.  Dispense: 30 tablet; Refill: 0 -      Ambulatory referral to Neurology     Follow-up as scheduled in July, sooner if needed   Rolan Hoyle, PA-C, DMSc, DipACLM, Nutritionist [facility] [phone]  [phone]      [1]  Current Meds  Medication Sig   fluticasone  (FLONASE ) 50 MCG/ACT nasal spray Place 2 sprays into both nostrils daily.  [2] No Known Allergies [3]  Social History Tobacco Use   Smoking status: Former    Current packs/day: 0.00    Average packs/day: 0.3 packs/day for 11.5 years (2.9 ttl pk-yrs)    Types: Cigarettes    Start date: 2014    Quit date: 08/08/2023    Years since quitting: 0.5   Smokeless tobacco: Never  Vaping Use   Vaping status: Never Used  Substance Use Topics   Alcohol use: Yes    Comment: rarely. Used to be daily.   Drug use: Yes    Types: Marijuana    Comment: occasionally/socially. Used to be daily.   "

## 2024-09-04 ENCOUNTER — Encounter: Admitting: Physician Assistant
